# Patient Record
Sex: Male | Born: 1935 | Race: White | Hispanic: No | Marital: Married | Smoking: Never smoker
Health system: Southern US, Community
[De-identification: ages and names within clinical notes are randomized; demographics above are authoritative.]

## PROBLEM LIST (undated history)

## (undated) DIAGNOSIS — I10 Essential (primary) hypertension: Secondary | ICD-10-CM

---

## 2020-12-02 ENCOUNTER — Emergency Department (HOSPITAL_COMMUNITY): Payer: Medicare HMO

## 2020-12-02 ENCOUNTER — Inpatient Hospital Stay (HOSPITAL_COMMUNITY)
Admission: EM | Admit: 2020-12-02 | Discharge: 2021-01-12 | DRG: 480 | Disposition: E | Payer: Medicare HMO | Attending: Internal Medicine | Admitting: Internal Medicine

## 2020-12-02 ENCOUNTER — Encounter (HOSPITAL_COMMUNITY): Payer: Self-pay | Admitting: Internal Medicine

## 2020-12-02 DIAGNOSIS — Z66 Do not resuscitate: Secondary | ICD-10-CM | POA: Diagnosis present

## 2020-12-02 DIAGNOSIS — I444 Left anterior fascicular block: Secondary | ICD-10-CM | POA: Diagnosis present

## 2020-12-02 DIAGNOSIS — N179 Acute kidney failure, unspecified: Secondary | ICD-10-CM | POA: Diagnosis present

## 2020-12-02 DIAGNOSIS — R410 Disorientation, unspecified: Secondary | ICD-10-CM | POA: Diagnosis not present

## 2020-12-02 DIAGNOSIS — Z7982 Long term (current) use of aspirin: Secondary | ICD-10-CM

## 2020-12-02 DIAGNOSIS — Y92009 Unspecified place in unspecified non-institutional (private) residence as the place of occurrence of the external cause: Secondary | ICD-10-CM

## 2020-12-02 DIAGNOSIS — Z20822 Contact with and (suspected) exposure to covid-19: Secondary | ICD-10-CM | POA: Diagnosis present

## 2020-12-02 DIAGNOSIS — G9341 Metabolic encephalopathy: Secondary | ICD-10-CM | POA: Diagnosis not present

## 2020-12-02 DIAGNOSIS — M80052A Age-related osteoporosis with current pathological fracture, left femur, initial encounter for fracture: Principal | ICD-10-CM | POA: Diagnosis present

## 2020-12-02 DIAGNOSIS — Z419 Encounter for procedure for purposes other than remedying health state, unspecified: Secondary | ICD-10-CM

## 2020-12-02 DIAGNOSIS — W1830XA Fall on same level, unspecified, initial encounter: Secondary | ICD-10-CM | POA: Diagnosis present

## 2020-12-02 DIAGNOSIS — E870 Hyperosmolality and hypernatremia: Secondary | ICD-10-CM | POA: Diagnosis not present

## 2020-12-02 DIAGNOSIS — Z7189 Other specified counseling: Secondary | ICD-10-CM | POA: Diagnosis not present

## 2020-12-02 DIAGNOSIS — R339 Retention of urine, unspecified: Secondary | ICD-10-CM | POA: Diagnosis not present

## 2020-12-02 DIAGNOSIS — D539 Nutritional anemia, unspecified: Secondary | ICD-10-CM | POA: Diagnosis not present

## 2020-12-02 DIAGNOSIS — R112 Nausea with vomiting, unspecified: Secondary | ICD-10-CM

## 2020-12-02 DIAGNOSIS — E785 Hyperlipidemia, unspecified: Secondary | ICD-10-CM | POA: Diagnosis present

## 2020-12-02 DIAGNOSIS — Y92019 Unspecified place in single-family (private) house as the place of occurrence of the external cause: Secondary | ICD-10-CM

## 2020-12-02 DIAGNOSIS — N1831 Chronic kidney disease, stage 3a: Secondary | ICD-10-CM | POA: Diagnosis present

## 2020-12-02 DIAGNOSIS — Z79899 Other long term (current) drug therapy: Secondary | ICD-10-CM | POA: Diagnosis not present

## 2020-12-02 DIAGNOSIS — M810 Age-related osteoporosis without current pathological fracture: Secondary | ICD-10-CM | POA: Diagnosis not present

## 2020-12-02 DIAGNOSIS — I10 Essential (primary) hypertension: Secondary | ICD-10-CM | POA: Diagnosis not present

## 2020-12-02 DIAGNOSIS — S72142A Displaced intertrochanteric fracture of left femur, initial encounter for closed fracture: Secondary | ICD-10-CM | POA: Diagnosis present

## 2020-12-02 DIAGNOSIS — I129 Hypertensive chronic kidney disease with stage 1 through stage 4 chronic kidney disease, or unspecified chronic kidney disease: Secondary | ICD-10-CM | POA: Diagnosis present

## 2020-12-02 DIAGNOSIS — D696 Thrombocytopenia, unspecified: Secondary | ICD-10-CM | POA: Diagnosis present

## 2020-12-02 DIAGNOSIS — T148XXA Other injury of unspecified body region, initial encounter: Secondary | ICD-10-CM

## 2020-12-02 DIAGNOSIS — R739 Hyperglycemia, unspecified: Secondary | ICD-10-CM | POA: Diagnosis present

## 2020-12-02 DIAGNOSIS — W19XXXA Unspecified fall, initial encounter: Secondary | ICD-10-CM | POA: Diagnosis not present

## 2020-12-02 DIAGNOSIS — Z515 Encounter for palliative care: Secondary | ICD-10-CM | POA: Diagnosis not present

## 2020-12-02 DIAGNOSIS — H9190 Unspecified hearing loss, unspecified ear: Secondary | ICD-10-CM | POA: Diagnosis present

## 2020-12-02 DIAGNOSIS — F05 Delirium due to known physiological condition: Secondary | ICD-10-CM | POA: Diagnosis not present

## 2020-12-02 HISTORY — DX: Essential (primary) hypertension: I10

## 2020-12-02 LAB — COMPREHENSIVE METABOLIC PANEL
ALT: 18 U/L (ref 0–44)
AST: 26 U/L (ref 15–41)
Albumin: 3.1 g/dL — ABNORMAL LOW (ref 3.5–5.0)
Alkaline Phosphatase: 110 U/L (ref 38–126)
Anion gap: 12 (ref 5–15)
BUN: 18 mg/dL (ref 8–23)
CO2: 26 mmol/L (ref 22–32)
Calcium: 8.5 mg/dL — ABNORMAL LOW (ref 8.9–10.3)
Chloride: 103 mmol/L (ref 98–111)
Creatinine, Ser: 1.17 mg/dL (ref 0.61–1.24)
GFR, Estimated: 58 mL/min — ABNORMAL LOW (ref >60)
Glucose, Bld: 101 mg/dL — ABNORMAL HIGH (ref 70–99)
Potassium: 3.9 mmol/L (ref 3.5–5.1)
Sodium: 141 mmol/L (ref 135–145)
Total Bilirubin: 0.9 mg/dL (ref 0.3–1.2)
Total Protein: 5.4 g/dL — ABNORMAL LOW (ref 6.5–8.1)

## 2020-12-02 LAB — CBC WITH DIFFERENTIAL/PLATELET
Abs Immature Granulocytes: 0.03 10*3/uL (ref 0.00–0.07)
Basophils Absolute: 0 10*3/uL (ref 0.0–0.1)
Basophils Relative: 0 %
Eosinophils Absolute: 0.1 10*3/uL (ref 0.0–0.5)
Eosinophils Relative: 2 %
HCT: 43.1 % (ref 39.0–52.0)
Hemoglobin: 14.2 g/dL (ref 13.0–17.0)
Immature Granulocytes: 1 %
Lymphocytes Relative: 11 %
Lymphs Abs: 0.6 10*3/uL — ABNORMAL LOW (ref 0.7–4.0)
MCH: 32.4 pg (ref 26.0–34.0)
MCHC: 32.9 g/dL (ref 30.0–36.0)
MCV: 98.4 fL (ref 80.0–100.0)
Monocytes Absolute: 0.4 10*3/uL (ref 0.1–1.0)
Monocytes Relative: 8 %
Neutro Abs: 4.1 10*3/uL (ref 1.7–7.7)
Neutrophils Relative %: 78 %
Platelets: 154 10*3/uL (ref 150–400)
RBC: 4.38 MIL/uL (ref 4.22–5.81)
RDW: 13 % (ref 11.5–15.5)
WBC: 5.2 10*3/uL (ref 4.0–10.5)
nRBC: 0 % (ref 0.0–0.2)

## 2020-12-02 LAB — RESP PANEL BY RT-PCR (FLU A&B, COVID) ARPGX2
Influenza A by PCR: NEGATIVE
Influenza B by PCR: NEGATIVE
SARS Coronavirus 2 by RT PCR: NEGATIVE

## 2020-12-02 MED ORDER — IOHEXOL 300 MG/ML  SOLN
100.0000 mL | Freq: Once | INTRAMUSCULAR | Status: AC | PRN
  Administered 2020-12-02: 100 mL via INTRAVENOUS

## 2020-12-02 MED ORDER — HYDROMORPHONE HCL 1 MG/ML IJ SOLN: 0.5000 mg | INTRAMUSCULAR | Status: DC | PRN

## 2020-12-02 MED ORDER — HYDROMORPHONE HCL 1 MG/ML IJ SOLN
0.5000 mg | INTRAMUSCULAR | Status: DC | PRN
  Administered 2020-12-03 – 2020-12-11 (×7): 0.5 mg via INTRAVENOUS
  Filled 2020-12-02 (×4): qty 0.5
  Filled 2020-12-02: qty 1
  Filled 2020-12-02 (×3): qty 0.5

## 2020-12-02 MED ORDER — HYDROCODONE-ACETAMINOPHEN 5-325 MG PO TABS
1.0000 | ORAL_TABLET | Freq: Four times a day (QID) | ORAL | Status: DC | PRN
  Administered 2020-12-05 – 2020-12-11 (×7): 1 via ORAL
  Filled 2020-12-02 (×9): qty 1

## 2020-12-02 MED ORDER — POLYETHYLENE GLYCOL 3350 17 G PO PACK: 17.0000 g | PACK | Freq: Every day | ORAL | Status: DC | PRN

## 2020-12-02 MED ORDER — SODIUM CHLORIDE 0.9 % IV SOLN: INTRAVENOUS | Status: DC

## 2020-12-02 MED ORDER — SODIUM CHLORIDE 0.9 % IV BOLUS
500.0000 mL | Freq: Once | INTRAVENOUS | Status: AC
  Administered 2020-12-02: 17:00:00 500 mL via INTRAVENOUS

## 2020-12-02 MED ORDER — MORPHINE SULFATE (PF) 2 MG/ML IV SOLN
2.0000 mg | Freq: Once | INTRAVENOUS | Status: AC
  Administered 2020-12-02: 17:00:00 2 mg via INTRAVENOUS
  Filled 2020-12-02: qty 1

## 2020-12-02 NOTE — ED Triage Notes (Signed)
Pt arrived from home via [facility] after falling from standing while attempting to retrieve item from ground. Pt usually uses walker to assist with ambulation. Denies striking head. Endorses pain to left upper leg and hip. Shortening and flattening of left leg noted at time of triage. Pain controlled without meds PTA. Pt endorses sever pain with palpation or movement of leg. VSS.

## 2020-12-02 NOTE — ED Provider Notes (Signed)
[name] EMERGENCY DEPARTMENT Provider Note   CSN: [id number] Arrival date & time: 11/15/2020  1513     History Chief Complaint  Patient presents with  . Fall    From standing. Loss of balance while bending over. Usually uses walker to assist with ambulation.    Tom Robertson is a 86 y.o. male.  HPI   Patient is a 86 year old male who presents the emergency department due to a fall.  Patient states that he was reaching for a newspaper on the ground and then fell over to the ground.  Reports exquisite left hip pain.  Patient's left leg is shortened and externally rotated.  Exquisite pain with any movement of the left leg.  Patient unable to ambulate.  Patient ambulates with a walker at baseline.  No other regions of pain or other complaints at this time.  Denies head trauma to me.  Denies any anticoagulation.  Ambulates with a cane at baseline.  Will sometimes use a walker.  I spoke to patient's 2 sons.  They state he is on very few medications.  He takes a hypertension medication as well as aspirin and a multivitamin.  No anticoagulation, per both of patient's sons.  Last oral intake was about 6 hours ago.     No past medical history on file.  There are no problems to display for this patient.   No family history on file.     Home Medications Prior to Admission medications   Not on File    Allergies    Patient has no known allergies.  Review of Systems   Review of Systems  All other systems reviewed and are negative. Ten systems reviewed and are negative for acute change, except as noted in the HPI.    Physical Exam Updated Vital Signs BP (!) 183/104 (BP Location: Right Arm)   Pulse 62   Temp 97.7 F (36.5 C) (Oral)   Resp 15   SpO2 97%   Physical Exam Vitals and nursing note reviewed.  Constitutional:      General: He is not in acute distress.    Appearance: Normal appearance. He is not ill-appearing, toxic-appearing or diaphoretic.   HENT:     Head: Normocephalic and atraumatic.     Right Ear: External ear normal.     Left Ear: External ear normal.     Nose: Nose normal.     Mouth/Throat:     Mouth: Mucous membranes are moist.     Pharynx: Oropharynx is clear. No oropharyngeal exudate or posterior oropharyngeal erythema.  Eyes:     Extraocular Movements: Extraocular movements intact.  Cardiovascular:     Rate and Rhythm: Normal rate and regular rhythm.     Pulses: Normal pulses.     Heart sounds: Normal heart sounds. No murmur heard. No friction rub. No gallop.   Pulmonary:     Effort: Pulmonary effort is normal. No respiratory distress.     Breath sounds: Normal breath sounds. No stridor. No wheezing, rhonchi or rales.  Abdominal:     General: Abdomen is flat.     Palpations: Abdomen is soft.     Tenderness: There is abdominal tenderness.     Comments: Abdomen is soft.  Voluntary guarding.  Tenderness noted along the left central abdomen.    Musculoskeletal:        General: Tenderness, deformity and signs of injury present. Normal range of motion.     Cervical back: Normal range of motion  and neck supple. No tenderness.     Comments: Exquisite tenderness with manipulation of the left hip.  No right hip tenderness.  Left leg is shortened and externally rotated.  Normal-appearing right lower extremity.  Patient able to move right lower extremity.  Distal sensation intact in the lower extremities.  Skin:    General: Skin is warm and dry.  Neurological:     General: No focal deficit present.     Mental Status: He is alert and oriented to person, place, and time.  Psychiatric:        Mood and Affect: Mood normal.        Behavior: Behavior normal.    ED Results / Procedures / Treatments   Labs (all labs ordered are listed, but only abnormal results are displayed) Labs Reviewed  COMPREHENSIVE METABOLIC PANEL - Abnormal; Notable for the following components:      Result Value   Glucose, Bld 101 (*)     Calcium 8.5 (*)    Total Protein 5.4 (*)    Albumin 3.1 (*)    GFR, Estimated 58 (*)    All other components within normal limits  CBC WITH DIFFERENTIAL/PLATELET - Abnormal; Notable for the following components:   Lymphs Abs 0.6 (*)    All other components within normal limits  RESP PANEL BY RT-PCR (FLU A&B, COVID) [id number]   EKG EKG Interpretation  Date/Time:  Wednesday December 02 2020 17:15:23 EST Ventricular Rate:  52 PR Interval:    QRS Duration: 113 QT Interval:  467 QTC Calculation: 435 R Axis:   -77 Text Interpretation: Sinus rhythm Consider left atrial enlargement Left anterior fascicular block Abnormal R-wave progression, late transition Probable left ventricular hypertrophy Confirmed by Kristine Royal [phone]) on 11/14/2020 5:28:19 PM  Radiology DG Pelvis Portable  Result Date: 12/04/2020 CLINICAL DATA:  Larey Seat, left leg deformity EXAM: PORTABLE PELVIS 1-2 VIEWS COMPARISON:  None. FINDINGS: Single frontal view of the pelvis demonstrates an intertrochanteric left hip fracture with mild varus angulation and significant distraction of the fracture fragments. No dislocation. No additional bony abnormalities. Mild symmetrical hip osteoarthritis. Diffuse lower lumbar spondylosis. Extensive vascular calcifications. IMPRESSION: 1. Comminuted intertrochanteric left hip fracture with varus angulation. Electronically Signed   By: Sharlet Salina M.D.   On: 11/25/2020 17:19   DG Chest Portable 1 View  Result Date: 11/11/2020 CLINICAL DATA:  Larey Seat, left hip fracture EXAM: PORTABLE CHEST 1 VIEW COMPARISON:  None. FINDINGS: Single frontal view of the chest demonstrates an unremarkable cardiac silhouette. No airspace disease, effusion, or pneumothorax. No acute bony abnormalities. IMPRESSION: 1. No acute intrathoracic process. Electronically Signed   By: Sharlet Salina M.D.   On: 12/10/2020 17:19   DG Femur Portable 1 View Left  Result Date: 11/23/2020 CLINICAL DATA:  Larey Seat, externally rotated  left hip with obvious deformity EXAM: LEFT FEMUR PORTABLE 1 VIEW COMPARISON:  None. FINDINGS: 2 frontal views. There is a comminuted of the left femur are obtained intertrochanteric left hip fracture with varus angulation. The distal aspect of the femur is externally rotated relative to the fracture site. There is no dislocation. Extensive vascular calcifications are seen. IMPRESSION: 1. Comminuted intertrochanteric left hip fracture. Electronically Signed   By: Sharlet Salina M.D.   On: 11/13/2020 17:18   Procedures Procedures   Medications Ordered in ED Medications  morphine 2 MG/ML injection 2 mg (2 mg Intravenous Given 11/15/2020 1633)  sodium chloride 0.9 % bolus 500 mL (500 mLs Intravenous New Bag/Given 11/25/2020 1635)  iohexol (OMNIPAQUE) 300  MG/ML solution 100 mL (100 mLs Intravenous Contrast Given 12/01/2020 1830)   ED Course  I have reviewed the triage vital signs and the nursing notes.  Pertinent labs & imaging results that were available during my care of the patient were reviewed by me and considered in my medical decision making (see chart for details).  Clinical Course as of 11/16/2020 1837  Wed Dec 02, 2020  2409 Patient discussed with Dr. Jena Gauss with orthopedic surgery.  He recommended admission to medicine.  N.p.o.  Will likely perform surgery on patient tomorrow. [LJ]    Clinical Course User Index [LJ] Jannet Mantis   MDM Rules/Calculators/A&P                         Patient is a 86 year old male who presents to the emergency department with a comminuted intertrochanteric left hip fracture.  This occurred after a fall earlier today.  Patient discussed with his son at bedside.  Not on anticoagulation.  Patient denies any head trauma but CT was obtained of the head, cervical spine, as well as the abdomen and pelvis.  CT images are pending.  Chest x-ray is reassuring.  Patient given IV morphine as well as 500 cc of IV fluids.  Pain under control this time.  Patient was  discussed with Dr. Jena Gauss with orthopedic surgery.  Recommended keeping patient n.p.o. and admission to medicine. Will likely perform surgery on patient tomorrow.  This was discussed with the patient as well as his son and they are amenable with this plan.  Will discuss with the medicine team for admission.  Final Clinical Impression(s) / ED Diagnoses Final diagnoses:  Fall, initial encounter  Closed displaced intertrochanteric fracture of left femur, initial encounter [facility])    Rx / DC Orders ED Discharge Orders    None       Placido Sou, PA-C 12/01/2020 Madilyn Hook, MD 11/29/2020 2342

## 2020-12-02 NOTE — H&P (Signed)
History and Physical   WHITTEN ANDREONI ENI:[id number] DOB: February 27, 1926 DOA: 11/15/2020  PCP: Joycelyn Man, NP   Patient coming from: [facility]  Chief Complaint: Marletta Lor  HPI: DOLORES MCGOVERN is a 86 y.o. male with medical history significant of hypertension who presents with hip pain following a fall from standing height.  Patient was reaching to pick up a newspaper from the ground and fell on his left side.  He reports severe left hip pain which is worse with any movement.  Pain is 10 out of 10 at its worst but is improved to moderate pain now.  He is been unable to walk since the fall.  He does walk with a cane at baseline.  He denies any head trauma or taking any anticoagulation.  He has 2 sons who the EDP spoke with who confirmed that he is on minimal medications only antihypertensive, aspirin and vitamins.  He denies fever, cough, chest pain, shortness of breath, abdominal pain, constipation, diarrhea, nausea.  ED Course: Vitals in ED significant for intermittent hypertension in the 140s to 190s systolic.  Some readings of low heart rate in the upper 50s.  Lab work-up showed BMP that was normal, LFTs with calcium of 8.6 which corrects considering albumin of 3.1, protein of 5.4.  CBC within normal notes.  Respiratory panel for flu and Covid pending.  Patient was given pain medication and and orthopedics was consulted for his fracture.  Chest x-ray was without acute disease.  DG pelvis and DG left femur showed left intratrochanteric hip fracture.  CT head was without acute process, but incidental hyperdense region noticed which may need follow-up MRI.  CT C-spine showed multilevel spondylosis but no acute fracture.  CT abdomen redemonstrated left femoral fracture otherwise showed no acute abdominal or pelvic process diverticulosis without diverticulitis noted  Review of Systems: As per HPI otherwise all other systems reviewed and are negative.  Past Medical History:  Diagnosis Date   . HTN (hypertension)    History reviewed. No pertinent surgical history.  Social History  reports that he has never smoked. He has never used smokeless tobacco. He reports current alcohol use. He reports that he does not use drugs.  No Known Allergies  History reviewed. No pertinent family history. Reviewed on admission  Prior to Admission medications   Not on File  - Antihypertensive, family is not sure what he is taking and neither is he - Aspirin - Multivitamin  Physical Exam: Vitals:   12/05/2020 1525 12/06/2020 1530 11/14/2020 1645 11/13/2020 1715  BP: (!) 183/104 101/69  (!) 197/108  Pulse: 62  (!) 59 (!) 56  Resp: 15  12 12   Temp: 97.7 F (36.5 C)     TempSrc: Oral     SpO2: 97%  98% 100%   Physical Exam Constitutional:      General: He is not in acute distress.    Appearance: Normal appearance.     Comments: Thin elderly male  HENT:     Head: Normocephalic and atraumatic.     Mouth/Throat:     Mouth: Mucous membranes are moist.     Pharynx: Oropharynx is clear.  Eyes:     Extraocular Movements: Extraocular movements intact.     Pupils: Pupils are equal, round, and reactive to light.  Cardiovascular:     Rate and Rhythm: Normal rate and regular rhythm.     Pulses: Normal pulses.     Heart sounds: Murmur heard.    Pulmonary:  Effort: Pulmonary effort is normal. No respiratory distress.     Breath sounds: Normal breath sounds.  Abdominal:     General: Bowel sounds are normal. There is no distension.     Palpations: Abdomen is soft.     Tenderness: There is no abdominal tenderness.  Musculoskeletal:        General: Deformity present. No swelling.     Comments: Left lower extremity is shortened and externally rotated.  He is neurovascularly intact in the left lower extremity.  Skin:    General: Skin is warm and dry.  Neurological:     General: No focal deficit present.     Mental Status: Mental status is at baseline.    Labs on Admission: I have  personally reviewed following labs and imaging studies  CBC: Recent Labs  Lab 12/23/20 1700  WBC 5.2  NEUTROABS 4.1  HGB 14.2  HCT 43.1  MCV 98.4  PLT 154    Basic Metabolic Panel: Recent Labs  Lab December 23, 2020 1700  NA 141  K 3.9  CL 103  CO2 26  GLUCOSE 101*  BUN 18  CREATININE 1.17  CALCIUM 8.5*    GFR: CrCl cannot be calculated (Unknown ideal weight.).  Liver Function Tests: Recent Labs  Lab December 23, 2020 1700  AST 26  ALT 18  ALKPHOS 110  BILITOT 0.9  PROT 5.4*  ALBUMIN 3.1*    Urine analysis: No results found for: COLORURINE, APPEARANCEUR, LABSPEC, PHURINE, GLUCOSEU, HGBUR, BILIRUBINUR, KETONESUR, PROTEINUR, UROBILINOGEN, NITRITE, LEUKOCYTESUR  Radiological Exams on Admission: CT Head Wo Contrast  Result Date: Dec 23, 2020 CLINICAL DATA:  Larey Seat from standing EXAM: CT HEAD WITHOUT CONTRAST TECHNIQUE: Contiguous axial images were obtained from the base of the skull through the vertex without intravenous contrast. COMPARISON:  None. FINDINGS: Brain: Confluent hypodensities throughout the periventricular and subcortical white matter are compatible with chronic small vessel ischemic changes. Hyperdense area in the left frontal periventricular white matter measuring approximately 1.7 cm is indeterminate, but could reflect an area of cortical dysplasia or vascular malformation. Follow-up nonemergent MRI may be useful to better characterize and exclude underlying mass. No evidence of acute infarct or hemorrhage. Lateral ventricles and remaining midline structures are unremarkable. No acute extra-axial fluid collections. No mass effect. Diffuse age-appropriate cerebral atrophy. Vascular: No evidence of hyperdense vessel. Mild atherosclerosis. 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 Skull: Normal. Negative for fracture or focal lesion. Sinuses/Orbits: No acute finding. Other: None. IMPRESSION: 1. No acute intracranial process. 2. Extensive chronic  small-vessel ischemic changes throughout the white matter. 3. Likely incidental 1.7 cm hyperdense region in the left frontal white matter, which could reflect an area of dysplasia or vascular malformation. Follow-up nonemergent MRI with and without contrast may be useful to better characterize and exclude underlying mass lesion. Electronically Signed   By: Sharlet Salina M.D.   On: December 23, 2020 18:53   CT Cervical Spine Wo Contrast  Result Date: Dec 23, 2020 CLINICAL DATA:  Larey Seat from standing, left hip fracture EXAM: CT CERVICAL SPINE WITHOUT CONTRAST TECHNIQUE: Multidetector CT imaging of the cervical spine was performed without intravenous contrast. Multiplanar CT image reconstructions were also generated. COMPARISON:  None. FINDINGS: Alignment: Alignment is anatomic. Skull base and vertebrae: No acute fracture. No primary bone lesion or focal pathologic process. Soft tissues and spinal canal: No prevertebral fluid or swelling. No visible canal hematoma. Disc levels: There is diffuse cervical spondylosis. Mild neural foraminal narrowing is seen at C3-4 and C4-5. No evidence of central canal stenosis. Upper chest: Airway  is patent.  Lung apices are clear. Other: Reconstructed images demonstrate no additional findings. IMPRESSION: 1. Extensive multilevel cervical spondylosis. No acute displaced fracture. Electronically Signed   By: Sharlet Salina M.D.   On: 11/11/2020 18:44   CT ABDOMEN PELVIS W CONTRAST  Result Date: 11/30/2020 CLINICAL DATA:  Left lower quadrant abdominal pain, recent fall, left hip fracture EXAM: CT ABDOMEN AND PELVIS WITH CONTRAST TECHNIQUE: Multidetector CT imaging of the abdomen and pelvis was performed using the standard protocol following bolus administration of intravenous contrast. CONTRAST:  OMNIPAQUE IOHEXOL 300 MG/ML  SOLN COMPARISON:  None. FINDINGS: Lower chest: No acute pleural or parenchymal lung disease. Dense calcification of the mitral annulus. Hepatobiliary: Small  calcified gallstones without cholecystitis. No evidence of liver injury. Pancreas: Unremarkable. No pancreatic ductal dilatation or surrounding inflammatory changes. Spleen: Multiple small hypodensities in the spleen likely reflects cysts or hemangiomas. No evidence of splenic injury. Adrenals/Urinary Tract: There is bilateral renal cortical atrophy. No evidence of renal injury. Bilateral renal cortical cysts are noted. No nephrolithiasis or obstructive uropathy. The bladder and adrenals are unremarkable. Stomach/Bowel: No bowel obstruction or ileus. There is diverticulosis of the distal colon with no evidence of acute diverticulitis. There is a short segment of wall thickening within the mid sigmoid colon reference image 52, without surrounding inflammatory change. This may be due to under distension or scarring from previous bouts of diverticulitis. If patient has bowel symptoms,, follow-up nonemergent colonoscopy or barium enema could be considered. Vascular/Lymphatic: Aortic atherosclerosis. No enlarged abdominal or pelvic lymph nodes. Reproductive: Prostate is likely surgically absent. Other: No free fluid or free intraperitoneal gas. No abdominal wall hernia. Musculoskeletal: The comminuted intertrochanteric fracture seen on previous imaging is again identified, with impaction and mild varus angulation. No dislocation. No other acute displaced fractures. Reconstructed images demonstrate no additional findings. IMPRESSION: 1. Comminuted impacted left femoral neck fracture. 2. Otherwise no acute intra-abdominal or intrapelvic trauma. 3. Diverticulosis of the colon without evidence of acute diverticulitis. Segmental wall thickening of the sigmoid colon may be due to scarring or under distension. 4. Cholelithiasis without cholecystitis. 5.  Aortic Atherosclerosis (ICD10-I70.0). Electronically Signed   By: Sharlet Salina M.D.   On: 12/06/2020 19:03   DG Pelvis Portable  Result Date: 12/10/2020 CLINICAL DATA:   Larey Seat, left leg deformity EXAM: PORTABLE PELVIS 1-2 VIEWS COMPARISON:  None. FINDINGS: Single frontal view of the pelvis demonstrates an intertrochanteric left hip fracture with mild varus angulation and significant distraction of the fracture fragments. No dislocation. No additional bony abnormalities. Mild symmetrical hip osteoarthritis. Diffuse lower lumbar spondylosis. Extensive vascular calcifications. IMPRESSION: 1. Comminuted intertrochanteric left hip fracture with varus angulation. Electronically Signed   By: Sharlet Salina M.D.   On: 12/07/2020 17:19   DG Chest Portable 1 View  Result Date: 12/08/2020 CLINICAL DATA:  Larey Seat, left hip fracture EXAM: PORTABLE CHEST 1 VIEW COMPARISON:  None. FINDINGS: Single frontal view of the chest demonstrates an unremarkable cardiac silhouette. No airspace disease, effusion, or pneumothorax. No acute bony abnormalities. IMPRESSION: 1. No acute intrathoracic process. Electronically Signed   By: Sharlet Salina M.D.   On: 11/20/2020 17:19   DG Femur Portable 1 View Left  Result Date: 11/27/2020 CLINICAL DATA:  Larey Seat, externally rotated left hip with obvious deformity EXAM: LEFT FEMUR PORTABLE 1 VIEW COMPARISON:  None. FINDINGS: 2 frontal views. There is a comminuted of the left femur are obtained intertrochanteric left hip fracture with varus angulation. The distal aspect of the femur is externally rotated relative to the  fracture site. There is no dislocation. Extensive vascular calcifications are seen. IMPRESSION: 1. Comminuted intertrochanteric left hip fracture. Electronically Signed   By: Sharlet SalinaMichael  Brown M.D.   On: 11/18/2020 17:18    EKG: Independently reviewed.  Sinus rhythm, 52 bpm.  Some baseline artifact.  Assessment/Plan Principal Problem:   Closed comminuted intertrochanteric fracture of left femur (HCC) Active Problems:   Fall at home, initial encounter   HTN (hypertension)   Osteoporosis  Fall Osteoporosis Intertrochanteric fracture of the  left femur > Patient had a fall from standing height while reaching to pick a newspaper up off the ground.  No preceding symptoms no loss of consciousness typically walks with a cane. > Did not hit his head, not on anticoagulation > Orthopedics consulted [facility] and will see patient in the morning > With fracture from fall from standing height meets definition for osteoporosis. - Appreciate orthopedics recommendations - Pain control with as needed hydrocodone for moderate pain and as needed Dilaudid for severe pain - Hip fracture precautions - AM labs, check vitamin D level - N.p.o. midnight  Hypertension > Intermittently hypertensive in ED > Is on antihypertensive medication but neither he nor family is sure what - We will hold medication for now and add as needed's if needed  CT findings > CT head was without acute process, but incidental hyperdense region noticed, follow-up nonemergent MRI recommended  DVT prophylaxis: SCDs, surgery in the a.m.  Code Status:   DNR  Family Communication:  Discussed with son Brett CanalesSteve, by phone.  He has another son as well.   Disposition Plan:   [place]  Anticipated DC to:  Skilled nursing  Anticipated DC date:  Pending clinical course  Anticipated DC barriers: None  Consults called:  Orthopedics, Dr. Jena GaussHaddix consulted by EDP  Admission status:  Inpatient, MedSurg  Severity of Illness: The appropriate patient status for this patient is INPATIENT. Inpatient status is judged to be reasonable and necessary in order to provide the required intensity of service to ensure the patient's safety. The patient's presenting symptoms, physical exam findings, and initial radiographic and laboratory data in the context of their chronic comorbidities is felt to place them at high risk for further clinical deterioration. Furthermore, it is not anticipated that the patient will be medically stable for discharge from the hospital within 2 midnights of  admission. The following factors support the patient status of inpatient.   " The patient's presenting symptoms include hip pain, fall. " The worrisome physical exam findings include hip pain. " The initial radiographic and laboratory data are worrisome because of x-ray showing left intratrochanteric hip fracture. " The chronic co-morbidities include hypertension.   * I certify that at the point of admission it is my clinical judgment that the patient will require inpatient hospital care spanning beyond 2 midnights from the point of admission due to high intensity of service, high risk for further deterioration and high frequency of surveillance required.Synetta Fail*   Tavien Chestnut B Karita Dralle MD [facility]  How to contact the [facility] Attending or Consulting provider 7A - 7P or covering provider during after hours 7P -7A, for this patient?   1. Check the care team in [facility] and look for a) attending/consulting [facility] provider listed and b) the [facility] team listed 2. Log into [email or web address] and use [facility]'s universal password to access. If you do not have the password, please contact the hospital operator. 3. Locate the [facility] provider you are looking for under [facility]  and page to a number that you can be directly reached. 4. If you still have difficulty reaching the provider, please page the [facility] (Director on Call) for the Hospitalists listed on amion for assistance.  11/18/2020, 8:03 PM

## 2020-12-02 NOTE — ED Notes (Signed)
Patient denies pain and is resting comfortably.  

## 2020-12-02 NOTE — ED Notes (Signed)
In to check on pt, and pt had taken all leads off, gown off, all blankets off and trying to get out of bed. Pt had pulled out IV from left hand. No bleeding noted. Gauze applied. Pt placed back on monitor and gown placed back on pt. Asked pt to remain in bed and patient laughed. Asked pt if he knew where he was and pt stated "In this bed". Will continue to monitor pt. Bed alarm turned on.

## 2020-12-02 NOTE — Progress Notes (Signed)
Ortho Trauma Note  Received call from PA [facility] regarding this patient.  86 year old male with ground-level fall with a left intertrochanteric femur fracture.  No known history of blood thinners.  We will plan for surgery tomorrow.  Patient is tentatively posted for surgery.  Will recommend n.p.o. after midnight.  Formal orthopedic consult to follow in the morning.  Roby Lofts, MD [facility] [phone] (office) [email or web address]

## 2020-12-03 ENCOUNTER — Inpatient Hospital Stay (HOSPITAL_COMMUNITY): Payer: Medicare HMO | Admitting: Certified Registered"

## 2020-12-03 ENCOUNTER — Inpatient Hospital Stay (HOSPITAL_COMMUNITY): Payer: Medicare HMO

## 2020-12-03 ENCOUNTER — Encounter (HOSPITAL_COMMUNITY): Admission: EM | Disposition: E | Payer: Self-pay | Source: Home / Self Care | Attending: Internal Medicine

## 2020-12-03 ENCOUNTER — Encounter (HOSPITAL_COMMUNITY): Payer: Self-pay | Admitting: Internal Medicine

## 2020-12-03 DIAGNOSIS — I10 Essential (primary) hypertension: Secondary | ICD-10-CM

## 2020-12-03 DIAGNOSIS — W19XXXA Unspecified fall, initial encounter: Secondary | ICD-10-CM | POA: Diagnosis not present

## 2020-12-03 DIAGNOSIS — M810 Age-related osteoporosis without current pathological fracture: Secondary | ICD-10-CM | POA: Diagnosis not present

## 2020-12-03 DIAGNOSIS — Y92009 Unspecified place in unspecified non-institutional (private) residence as the place of occurrence of the external cause: Secondary | ICD-10-CM

## 2020-12-03 DIAGNOSIS — E785 Hyperlipidemia, unspecified: Secondary | ICD-10-CM

## 2020-12-03 DIAGNOSIS — S72142A Displaced intertrochanteric fracture of left femur, initial encounter for closed fracture: Secondary | ICD-10-CM | POA: Diagnosis not present

## 2020-12-03 HISTORY — PX: INTRAMEDULLARY (IM) NAIL INTERTROCHANTERIC: SHX5875

## 2020-12-03 LAB — BASIC METABOLIC PANEL
Anion gap: 13 (ref 5–15)
BUN: 16 mg/dL (ref 8–23)
CO2: 25 mmol/L (ref 22–32)
Calcium: 8.6 mg/dL — ABNORMAL LOW (ref 8.9–10.3)
Chloride: 102 mmol/L (ref 98–111)
Creatinine, Ser: 1.17 mg/dL (ref 0.61–1.24)
GFR, Estimated: 58 mL/min — ABNORMAL LOW (ref >60)
Glucose, Bld: 136 mg/dL — ABNORMAL HIGH (ref 70–99)
Potassium: 4.1 mmol/L (ref 3.5–5.1)
Sodium: 140 mmol/L (ref 135–145)

## 2020-12-03 LAB — PROTIME-INR
INR: 1.1 (ref 0.8–1.2)
Prothrombin Time: 14.1 seconds (ref 11.4–15.2)

## 2020-12-03 LAB — CBC
HCT: 41.5 % (ref 39.0–52.0)
Hemoglobin: 13.4 g/dL (ref 13.0–17.0)
MCH: 31.8 pg (ref 26.0–34.0)
MCHC: 32.3 g/dL (ref 30.0–36.0)
MCV: 98.3 fL (ref 80.0–100.0)
Platelets: 145 10*3/uL — ABNORMAL LOW (ref 150–400)
RBC: 4.22 MIL/uL (ref 4.22–5.81)
RDW: 12.9 % (ref 11.5–15.5)
WBC: 9 10*3/uL (ref 4.0–10.5)
nRBC: 0 % (ref 0.0–0.2)

## 2020-12-03 LAB — GLUCOSE, CAPILLARY: Glucose-Capillary: 136 mg/dL — ABNORMAL HIGH (ref 70–99)

## 2020-12-03 LAB — VITAMIN D 25 HYDROXY (VIT D DEFICIENCY, FRACTURES): Vit D, 25-Hydroxy: 46.05 ng/mL (ref 30–100)

## 2020-12-03 SURGERY — FIXATION, FRACTURE, INTERTROCHANTERIC, WITH INTRAMEDULLARY ROD
Anesthesia: General | Laterality: Left

## 2020-12-03 MED ORDER — ROCURONIUM BROMIDE 10 MG/ML (PF) SYRINGE
PREFILLED_SYRINGE | INTRAVENOUS | Status: AC
  Filled 2020-12-03: qty 10

## 2020-12-03 MED ORDER — CEFAZOLIN SODIUM-DEXTROSE 2-4 GM/100ML-% IV SOLN
INTRAVENOUS | Status: AC
  Filled 2020-12-03: qty 100

## 2020-12-03 MED ORDER — LACTATED RINGERS IV SOLN: INTRAVENOUS | Status: DC | PRN

## 2020-12-03 MED ORDER — LIDOCAINE 2% (20 MG/ML) 5 ML SYRINGE
INTRAMUSCULAR | Status: AC
  Filled 2020-12-03: qty 5

## 2020-12-03 MED ORDER — DEXAMETHASONE SODIUM PHOSPHATE 10 MG/ML IJ SOLN
INTRAMUSCULAR | Status: AC
  Filled 2020-12-03: qty 1

## 2020-12-03 MED ORDER — ENOXAPARIN SODIUM 40 MG/0.4ML ~~LOC~~ SOLN
40.0000 mg | SUBCUTANEOUS | Status: DC
  Administered 2020-12-04: 09:00:00 40 mg via SUBCUTANEOUS
  Filled 2020-12-03: qty 0.4

## 2020-12-03 MED ORDER — ONDANSETRON HCL 4 MG/2ML IJ SOLN
INTRAMUSCULAR | Status: DC | PRN
Start: 2020-12-03 — End: 2020-12-03
  Administered 2020-12-03: 4 mg via INTRAVENOUS

## 2020-12-03 MED ORDER — OXYCODONE HCL 5 MG PO TABS: 5.0000 mg | ORAL_TABLET | Freq: Once | ORAL | Status: DC | PRN

## 2020-12-03 MED ORDER — ROCURONIUM BROMIDE 100 MG/10ML IV SOLN
INTRAVENOUS | Status: DC | PRN
  Administered 2020-12-03: 60 mg via INTRAVENOUS

## 2020-12-03 MED ORDER — FENTANYL CITRATE (PF) 250 MCG/5ML IJ SOLN
INTRAMUSCULAR | Status: AC
  Filled 2020-12-03: qty 5

## 2020-12-03 MED ORDER — FENTANYL CITRATE (PF) 100 MCG/2ML IJ SOLN: 25.0000 ug | INTRAMUSCULAR | Status: DC | PRN

## 2020-12-03 MED ORDER — CEFAZOLIN SODIUM-DEXTROSE 2-3 GM-%(50ML) IV SOLR
INTRAVENOUS | Status: DC | PRN
  Administered 2020-12-03: 2 g via INTRAVENOUS

## 2020-12-03 MED ORDER — ONDANSETRON HCL 4 MG/2ML IJ SOLN: 4.0000 mg | Freq: Once | INTRAMUSCULAR | Status: DC | PRN

## 2020-12-03 MED ORDER — ALBUMIN HUMAN 5 % IV SOLN: INTRAVENOUS | Status: DC | PRN

## 2020-12-03 MED ORDER — FENTANYL CITRATE (PF) 100 MCG/2ML IJ SOLN
INTRAMUSCULAR | Status: DC | PRN
  Administered 2020-12-03: 25 ug via INTRAVENOUS
  Administered 2020-12-03: 50 ug via INTRAVENOUS

## 2020-12-03 MED ORDER — VANCOMYCIN HCL 1000 MG IV SOLR
INTRAVENOUS | Status: DC | PRN
  Administered 2020-12-03: 1000 mg

## 2020-12-03 MED ORDER — DEXAMETHASONE SODIUM PHOSPHATE 10 MG/ML IJ SOLN
INTRAMUSCULAR | Status: DC | PRN
  Administered 2020-12-03: 4 mg via INTRAVENOUS

## 2020-12-03 MED ORDER — OXYCODONE HCL 5 MG/5ML PO SOLN: 5.0000 mg | Freq: Once | ORAL | Status: DC | PRN

## 2020-12-03 MED ORDER — ONDANSETRON HCL 4 MG/2ML IJ SOLN
INTRAMUSCULAR | Status: AC
  Filled 2020-12-03: qty 2

## 2020-12-03 MED ORDER — LIDOCAINE 2% (20 MG/ML) 5 ML SYRINGE
INTRAMUSCULAR | Status: DC | PRN
  Administered 2020-12-03: 60 mg via INTRAVENOUS

## 2020-12-03 MED ORDER — VANCOMYCIN HCL 1000 MG IV SOLR
INTRAVENOUS | Status: AC
  Filled 2020-12-03: qty 1000

## 2020-12-03 MED ORDER — SUGAMMADEX SODIUM 200 MG/2ML IV SOLN
INTRAVENOUS | Status: DC | PRN
  Administered 2020-12-03: 125 mg via INTRAVENOUS

## 2020-12-03 MED ORDER — EPHEDRINE SULFATE-NACL 50-0.9 MG/10ML-% IV SOSY
PREFILLED_SYRINGE | INTRAVENOUS | Status: DC | PRN
  Administered 2020-12-03 (×4): 5 mg via INTRAVENOUS

## 2020-12-03 MED ORDER — CEFAZOLIN SODIUM-DEXTROSE 2-4 GM/100ML-% IV SOLN
2.0000 g | Freq: Three times a day (TID) | INTRAVENOUS | Status: AC
  Administered 2020-12-03 – 2020-12-04 (×3): 2 g via INTRAVENOUS
  Filled 2020-12-03 (×3): qty 100

## 2020-12-03 MED ORDER — LIDOCAINE 2% (20 MG/ML) 5 ML SYRINGE
INTRAMUSCULAR | Status: AC
  Filled 2020-12-03: qty 15

## 2020-12-03 MED ORDER — AMISULPRIDE (ANTIEMETIC) 5 MG/2ML IV SOLN: 10.0000 mg | Freq: Once | INTRAVENOUS | Status: DC | PRN

## 2020-12-03 MED ORDER — PROPOFOL 10 MG/ML IV BOLUS
INTRAVENOUS | Status: DC | PRN
Start: 2020-12-03 — End: 2020-12-03
  Administered 2020-12-03: 60 mg via INTRAVENOUS

## 2020-12-03 MED ORDER — PROPOFOL 10 MG/ML IV BOLUS
INTRAVENOUS | Status: AC
  Filled 2020-12-03: qty 20

## 2020-12-03 MED ORDER — 0.9 % SODIUM CHLORIDE (POUR BTL) OPTIME
TOPICAL | Status: DC | PRN
  Administered 2020-12-03: 15:00:00 1000 mL

## 2020-12-03 SURGICAL SUPPLY — 48 items
ADH SKN CLS APL DERMABOND .7 (GAUZE/BANDAGES/DRESSINGS) ×1
APL PRP STRL LF DISP 70% ISPRP (MISCELLANEOUS) ×1
BIT DRILL INTERTAN LAG SCREW (BIT) ×1 IMPLANT
BIT DRILL LONG 4.0 (BIT) IMPLANT
BRUSH SCRUB EZ PLAIN DRY (MISCELLANEOUS) ×4 IMPLANT
CHLORAPREP W/TINT 26 (MISCELLANEOUS) ×2 IMPLANT
COVER PERINEAL POST (MISCELLANEOUS) ×2 IMPLANT
COVER SURGICAL LIGHT HANDLE (MISCELLANEOUS) ×2 IMPLANT
DERMABOND ADVANCED (GAUZE/BANDAGES/DRESSINGS) ×1
DERMABOND ADVANCED .7 DNX12 (GAUZE/BANDAGES/DRESSINGS) ×1 IMPLANT
DRAPE C-ARM 35X43 STRL (DRAPES) ×2 IMPLANT
DRAPE IMP U-DRAPE 54X76 (DRAPES) ×4 IMPLANT
DRAPE INCISE IOBAN 66X45 STRL (DRAPES) ×2 IMPLANT
DRAPE STERI IOBAN 125X83 (DRAPES) ×2 IMPLANT
DRAPE SURG 17X23 STRL (DRAPES) ×4 IMPLANT
DRAPE U-SHAPE 47X51 STRL (DRAPES) ×2 IMPLANT
DRILL BIT LONG 4.0 (BIT) ×2
DRSG MEPILEX BORDER 4X4 (GAUZE/BANDAGES/DRESSINGS) ×2 IMPLANT
DRSG MEPILEX BORDER 4X8 (GAUZE/BANDAGES/DRESSINGS) ×2 IMPLANT
ELECT REM PT RETURN 9FT ADLT (ELECTROSURGICAL) ×2
ELECTRODE REM PT RTRN 9FT ADLT (ELECTROSURGICAL) ×1 IMPLANT
GLOVE BIO SURGEON STRL SZ 6.5 (GLOVE) ×6 IMPLANT
GLOVE BIO SURGEON STRL SZ7.5 (GLOVE) ×8 IMPLANT
GLOVE BIOGEL PI IND STRL 6.5 (GLOVE) ×1 IMPLANT
GLOVE BIOGEL PI IND STRL 7.5 (GLOVE) ×1 IMPLANT
GLOVE BIOGEL PI INDICATOR 6.5 (GLOVE) ×1
GLOVE BIOGEL PI INDICATOR 7.5 (GLOVE) ×1
GOWN STRL REUS W/ TWL LRG LVL3 (GOWN DISPOSABLE) ×1 IMPLANT
GOWN STRL REUS W/TWL LRG LVL3 (GOWN DISPOSABLE) ×2
GUIDE PIN 3.2X343 (PIN) ×2
GUIDE PIN 3.2X343MM (PIN) ×4
KIT BASIN OR (CUSTOM PROCEDURE TRAY) ×2 IMPLANT
KIT TURNOVER KIT B (KITS) ×2 IMPLANT
MANIFOLD NEPTUNE II (INSTRUMENTS) ×2 IMPLANT
NAIL INTERTAN 10X18 130D 10S (Nail) ×1 IMPLANT
NS IRRIG 1000ML POUR BTL (IV SOLUTION) ×2 IMPLANT
PACK GENERAL/GYN (CUSTOM PROCEDURE TRAY) ×2 IMPLANT
PAD ARMBOARD 7.5X6 YLW CONV (MISCELLANEOUS) ×4 IMPLANT
PIN GUIDE 3.2X343MM (PIN) IMPLANT
SCREW LAG COMPR KIT 105/100 (Screw) ×1 IMPLANT
SCREW TRIGEN LOW PROF 5.0X37.5 (Screw) ×1 IMPLANT
SUT MNCRL AB 3-0 PS2 18 (SUTURE) ×2 IMPLANT
SUT VIC AB 0 CT1 27 (SUTURE) ×2
SUT VIC AB 0 CT1 27XBRD ANBCTR (SUTURE) IMPLANT
SUT VIC AB 2-0 CT1 27 (SUTURE) ×4
SUT VIC AB 2-0 CT1 TAPERPNT 27 (SUTURE) ×2 IMPLANT
TOWEL GREEN STERILE (TOWEL DISPOSABLE) ×4 IMPLANT
WATER STERILE IRR 1000ML POUR (IV SOLUTION) ×2 IMPLANT

## 2020-12-03 NOTE — Op Note (Signed)
Orthopaedic Surgery Operative Note (CSN: [id number] ) Date of Surgery: 12/21/2020  Admit Date: 11/19/2020   Diagnoses: Pre-Op Diagnoses: Left intertrochanteric femur fracture   Post-Op Diagnosis: Same  Procedures: CPT 27245-Cephalomedullary nailing of left intertrochanteric femur fracture   Surgeons : Primary: Roby Lofts, MD  Assistant: Ulyses Southward, PA-C  Location: OR 3   Anesthesia:General  Antibiotics: Ancef 2g preop with 1 gm vancomycin powder placed topically   Tourniquet time: None  Estimated Blood Loss:100 mL  Complications:None   Specimens:None   Implants: Implant Name Type Inv. Item Serial No. Manufacturer Lot No. LRB No. Used Action  NAIL INTERTAN 10X18 130D 10S - [id number] Nail NAIL INTERTAN 10X18 130D 10S  [employer] [id number] Left 1 Implanted  SCREW LAG DUAL 105/100 - [id number] Screw SCREW LAG DUAL 105/100  [employer] [id number] Left 1 Implanted  SCREW TRIGEN LOW PROF 5.0X37.5 - [id number] Screw SCREW TRIGEN LOW PROF 5.0X37.5  [employer]  Left 1 Implanted     Indications for Surgery: 86 year old male who sustained a ground-level fall and has a left intertrochanteric femur fracture.  I recommended undergoing cephalomedullary nailing.  Risks and benefits were discussed with the patient's son.  Risks include but not limited to bleeding, infection, malunion, nonunion, hardware failure, hardware irritation, nerve and blood vessel injury, DVT, even the possibility anesthetic complications.  The patient's family agreed to proceed with surgery and consent was obtained.  Operative Findings: Cephalomedullary nailing of left intertrochanteric femur fracture using [employer] InterTAN 10 x 180 mm with a 105/100 mm lag screw/compression screw combo  Procedure: The patient was identified in the preoperative holding area. Consent was confirmed with the patient and their family and all questions were answered. The  operative extremity was marked after confirmation with the patient. he was then brought back to the operating room by our anesthesia colleagues.  He was placed under general anesthetic and carefully transferred over to a Hana table.  His feet were positioned appropriately in the boots.  All bony prominences were well-padded.  Fluoroscopic imaging was obtained.  Traction was applied to the lower extremity to obtain adequate reduction.  Left lower extremity was then prepped and draped in usual sterile fashion.  A timeout was performed to verify the patient, the procedure, and the extremity.  The incision was made proximal to the greater trochanter.  I split the gluteal fascia in line with my incision and I directed a threaded guidewire at the tip of the greater trochanter.  I then used an entry reamer to enter the medullary canal.  I then passed the 180 x 10 mm InterTAN nail down the center canal.  I used the targeting arm to make a lateral incision along the lateral femur.  I directed a threaded guidewire up into the head neck segment.  I confirmed that the tip apex distance was adequate.  I then drilled the compression screw path.  I placed an antirotation bar in place.  I then placed the lag screw and then compressed approximately 5 mm.  I then statically locked the the nail.  I then used the targeting arm to place a distal interlocking screw.  Final fluoroscopic imaging was obtained.  The incisions were copiously irrigated.  A gram of vancomycin powder was placed into the incisions.  A layer closure of 0 Vicryl, 2-0 Vicryl and 3-0 Monocryl.  Dermabond was used to seal the incision.  Sterile dressings were placed.  The patient was then awoken from  anesthesia and taken to the PACU in stable condition.  Post Op Plan/Instructions: Patient will be weightbearing as tolerated to left lower extremity.  He will receive Lovenox for DVT prophylaxis.  He will receive Ancef for surgical prophylaxis.  We will mobilize  him with physical and Occupational Therapy.  I was present and performed the entire surgery.  Ulyses Southward, PA-C did assist me throughout the case. An assistant was necessary given the difficulty in approach, maintenance of reduction and ability to instrument the fracture.   Truitt Merle, MD [facility]

## 2020-12-03 NOTE — Transfer of Care (Signed)
Immediate Anesthesia Transfer of Care Note  Patient: Tom Robertson  Procedure(s) Performed: INTRAMEDULLARY (IM) NAIL INTERTROCHANTRIC (Left )  Patient Location: PACU  Anesthesia Type:General  Level of Consciousness: drowsy  Airway & Oxygen Therapy: Patient Spontanous Breathing and Patient connected to face mask oxygen  Post-op Assessment: Report given to RN and Post -op Vital signs reviewed and stable  Post vital signs: Reviewed and stable  Last Vitals:  Vitals Value Taken Time  BP 152/72 12/02/2020 1539  Temp    Pulse 65 11/25/2020 1541  Resp 12 11/13/2020 1541  SpO2 95 % 12/09/2020 1541  Vitals shown include unvalidated device data.  Last Pain:  Vitals:   11/23/2020 1228  TempSrc:   PainSc: 3          Complications: No complications documented.

## 2020-12-03 NOTE — ED Notes (Signed)
Sitter to bedside

## 2020-12-03 NOTE — Anesthesia Procedure Notes (Signed)
Procedure Name: Intubation Date/Time: 11/15/2020 2:23 PM Performed by: Marny Lowenstein, CRNA Pre-anesthesia Checklist: Patient identified, Emergency Drugs available, Suction available and Patient being monitored Patient Re-evaluated:Patient Re-evaluated prior to induction Oxygen Delivery Method: Circle system utilized Preoxygenation: Pre-oxygenation with 100% oxygen Induction Type: IV induction Ventilation: Mask ventilation without difficulty Laryngoscope Size: Agyeman and 2 Grade View: Grade I Tube type: Oral Tube size: 7.0 mm Number of attempts: 1 Airway Equipment and Method: Patient positioned with wedge pillow and Stylet Placement Confirmation: ETT inserted through vocal cords under direct vision,  positive ETCO2 and breath sounds checked- equal and bilateral Secured at: 24 cm Tube secured with: Tape Dental Injury: Teeth and Oropharynx as per pre-operative assessment

## 2020-12-03 NOTE — Consult Note (Signed)
Orthopaedic Trauma Service (OTS) Consult   Patient ID: Tom Robertson MRN: [id number] DOB/AGE: 07/09/1926 86 y.o.  Reason for Consult:Left intertrochanteric femur fracture Referring Physician: Dr. Kristine Royal, MD [facility]  HPI: Tom Robertson is an 86 y.o. male who is being seen in consultation at request of Dr. Rodena Medin for evaluation of left hip fracture.  Patient has a history of hypertension.  According to reports she was reaching down to pick up a newspaper when he fell on the ground.  He had inability bear weight and deformity to his left lower extremity.  He presents emergency room where x-rays showed a left intertrochanteric femur fracture.  I was consulted for evaluation.  Patient was seen and evaluated this morning.  Currently comfortable but is agitated.  A sitter is at bedside.  Patient is not oriented to place.  Seems confused as to whether his hip is broken but is in significant amount of pain.  I did discuss things over with his son.  He does ambulate without an assist device at baseline.  He lives at [facility] independent living facility.  Denies any numbness or tingling.  Denies any injuries anywhere else however his history is unreliable.  Past Medical History:  Diagnosis Date  . HTN (hypertension)     History reviewed. No pertinent surgical history.  History reviewed. No pertinent family history.  Social History:  reports that he has never smoked. He has never used smokeless tobacco. He reports current alcohol use. He reports that he does not use drugs.  Allergies: No Known Allergies  Medications:  No current facility-administered medications on file prior to encounter.   Current Outpatient Medications on File Prior to Encounter  Medication Sig Dispense Refill  . aspirin EC 81 MG tablet Take 81 mg by mouth daily. Swallow whole.    Marland Kitchen atorvastatin (LIPITOR) 20 MG tablet Take 20 mg by mouth daily.    Marland Kitchen lisinopril (ZESTRIL) 20 MG tablet Take 20 mg by mouth  daily.    . Multiple Vitamin (MULTIVITAMIN) tablet Take 1 tablet by mouth daily.      ROS: Unable to perform secondary to patient's confusion  Exam: Blood pressure (!) 153/93, pulse 64, temperature 97.7 F (36.5 C), temperature source Oral, resp. rate 17, SpO2 95 %. General: No acute distress Orientation: Patient is awake and alert but is not oriented to place or time Mood and Affect: Cooperative and pleasant Gait: Unable to assess due to his fracture Coordination and balance: Within normal limits  Left lower extremity: No skin lesions.  No bruising or ecchymosis.  Obvious deformity to the lower extremity with shortening and external rotation.  Unable to tolerate any range of motion.  No deformity about the knee and no swelling.  Ankle motion is intact.  Endorses sensation to the dorsum and plantar aspect of his foot.  He has a warm well-perfused foot.  Right lower extremity: Skin without lesions. No tenderness to palpation. Full painless ROM, full strength in each muscle groups without evidence of instability.   Medical Decision Making: Data: Imaging: X-rays were reviewed which shows a comminuted left intertrochanteric femur fracture with significant varus displacement.  Labs:  Results for orders placed or performed during the hospital encounter of 11/21/2020 (from the past 24 hour(s))  Resp Panel by RT-PCR (Flu A&B, Covid) Nasopharyngeal Swab     Status: None   Collection Time: 11/18/2020  3:52 PM   Specimen: Nasopharyngeal Swab; Nasopharyngeal(NP) swabs in vial transport medium  Result Value Ref  Range   SARS Coronavirus 2 by RT PCR NEGATIVE NEGATIVE   Influenza A by PCR NEGATIVE NEGATIVE   Influenza B by PCR NEGATIVE NEGATIVE  Comprehensive metabolic panel     Status: Abnormal   Collection Time: Dec 25, 2020  5:00 PM  Result Value Ref Range   Sodium 141 135 - 145 mmol/L   Potassium 3.9 3.5 - 5.1 mmol/L   Chloride 103 98 - 111 mmol/L   CO2 26 22 - 32 mmol/L   Glucose, Bld 101 (H) 70  - 99 mg/dL   BUN 18 8 - 23 mg/dL   Creatinine, Ser 2.72 0.61 - 1.24 mg/dL   Calcium 8.5 (L) 8.9 - 10.3 mg/dL   Total Protein 5.4 (L) 6.5 - 8.1 g/dL   Albumin 3.1 (L) 3.5 - 5.0 g/dL   AST 26 15 - 41 U/L   ALT 18 0 - 44 U/L   Alkaline Phosphatase 110 38 - 126 U/L   Total Bilirubin 0.9 0.3 - 1.2 mg/dL   GFR, Estimated 58 (L) >60 mL/min   Anion gap 12 5 - 15  CBC with Differential     Status: Abnormal   Collection Time: 12/25/20  5:00 PM  Result Value Ref Range   WBC 5.2 4.0 - 10.5 K/uL   RBC 4.38 4.22 - 5.81 MIL/uL   Hemoglobin 14.2 13.0 - 17.0 g/dL   HCT 53.6 64.4 - 03.4 %   MCV 98.4 80.0 - 100.0 fL   MCH 32.4 26.0 - 34.0 pg   MCHC 32.9 30.0 - 36.0 g/dL   RDW 74.2 59.5 - 63.8 %   Platelets 154 150 - 400 K/uL   nRBC 0.0 0.0 - 0.2 %   Neutrophils Relative % 78 %   Neutro Abs 4.1 1.7 - 7.7 K/uL   Lymphocytes Relative 11 %   Lymphs Abs 0.6 (L) 0.7 - 4.0 K/uL   Monocytes Relative 8 %   Monocytes Absolute 0.4 0.1 - 1.0 K/uL   Eosinophils Relative 2 %   Eosinophils Absolute 0.1 0.0 - 0.5 K/uL   Basophils Relative 0 %   Basophils Absolute 0.0 0.0 - 0.1 K/uL   Immature Granulocytes 1 %   Abs Immature Granulocytes 0.03 0.00 - 0.07 K/uL  CBC     Status: Abnormal   Collection Time: 12/10/2020  5:30 AM  Result Value Ref Range   WBC 9.0 4.0 - 10.5 K/uL   RBC 4.22 4.22 - 5.81 MIL/uL   Hemoglobin 13.4 13.0 - 17.0 g/dL   HCT 75.6 43.3 - 29.5 %   MCV 98.3 80.0 - 100.0 fL   MCH 31.8 26.0 - 34.0 pg   MCHC 32.3 30.0 - 36.0 g/dL   RDW 18.8 41.6 - 60.6 %   Platelets 145 (L) 150 - 400 K/uL   nRBC 0.0 0.0 - 0.2 %  Basic metabolic panel     Status: Abnormal   Collection Time: 12/04/2020  5:30 AM  Result Value Ref Range   Sodium 140 135 - 145 mmol/L   Potassium 4.1 3.5 - 5.1 mmol/L   Chloride 102 98 - 111 mmol/L   CO2 25 22 - 32 mmol/L   Glucose, Bld 136 (H) 70 - 99 mg/dL   BUN 16 8 - 23 mg/dL   Creatinine, Ser 3.01 0.61 - 1.24 mg/dL   Calcium 8.6 (L) 8.9 - 10.3 mg/dL   GFR, Estimated 58  (L) >60 mL/min   Anion gap 13 5 - 15  Protime-INR  Status: None   Collection Time: 12/19/20  5:30 AM  Result Value Ref Range   Prothrombin Time 14.1 11.4 - 15.2 seconds   INR 1.1 0.8 - 1.2    Imaging or Labs ordered: None  Medical history and chart was reviewed and case discussed with medical provider.  Assessment/Plan: 86 year old male with a left intertrochanteric femur fracture.  Patient will require cephalomedullary nailing of his left hip fracture.  I discussed this over the phone with his son.  I discussed risks and benefits with the patient's son.  Risks included but not limited to bleeding, infection, malunion, nonunion, hardware failure, hardware irritation, nerve and blood vessel injury, DVT, even the possibility anesthetic complications.  He agrees to proceed with surgery and consent will be obtained.  We will plan for surgery later today.  Roby Lofts, MD [facility] [phone] (office) [email or web address]

## 2020-12-03 NOTE — Anesthesia Preprocedure Evaluation (Signed)
Anesthesia Evaluation  Patient identified by MRN, date of birth, ID band Patient awake    Reviewed: Allergy & Precautions, NPO status , Patient's Chart, lab work & pertinent test results  History of Anesthesia Complications Negative for: history of anesthetic complications  Airway Mallampati: II  TM Distance: >3 FB Neck ROM: Full    Dental  (+) Teeth Intact   Pulmonary neg pulmonary ROS,    Pulmonary exam normal        Cardiovascular hypertension, Pt. on medications Normal cardiovascular exam  HLD   Neuro/Psych negative neurological ROS  negative psych ROS   GI/Hepatic negative GI ROS, Neg liver ROS,   Endo/Other  negative endocrine ROS  Renal/GU negative Renal ROS  negative genitourinary   Musculoskeletal negative musculoskeletal ROS (+)   Abdominal   Peds  Hematology negative hematology ROS (+)   Anesthesia Other Findings  plts 145, INR 1.1  Reproductive/Obstetrics                           Anesthesia Physical Anesthesia Plan  ASA: III  Anesthesia Plan: General   Post-op Pain Management:    Induction: Intravenous  PONV Risk Score and Plan: 2 and Ondansetron, Dexamethasone, Treatment may vary due to age or medical condition and Midazolam  Airway Management Planned: Oral ETT  Additional Equipment: None  Intra-op Plan:   Post-operative Plan: Extubation in OR  Informed Consent: I have reviewed the patients History and Physical, chart, labs and discussed the procedure including the risks, benefits and alternatives for the proposed anesthesia with the patient or authorized representative who has indicated his/her understanding and acceptance.   Patient has DNR.  Discussed DNR with power of attorney and Suspend DNR.   Dental advisory given and Consent reviewed with POA  Plan Discussed with:   Anesthesia Plan Comments:        Anesthesia Quick Evaluation

## 2020-12-03 NOTE — ED Notes (Signed)
IV fluids stopped due to pt pulling at line and will not leave IV alone unless someone at bedside.

## 2020-12-03 NOTE — ED Notes (Signed)
Pt continues to pull everything off. Will not keep monitor leads on, IV cannot stay connected without someone at bedside or pt will pull it out. Pt will not leave pulse ox and blood pressure cuff on. This RN has been into pts room everytime walked by to dress pt and place leads back on pt. Called for a sitter, but none are available.

## 2020-12-03 NOTE — Progress Notes (Signed)
PROGRESS NOTE    Tom Robertson  UXN:[id number] DOB: 1926-02-22 DOA: 12-05-2020 PCP: Joycelyn Man, NP   Brief Narrative:  HPI per Dr. Beola Cord on 2020/12/05 Tom Robertson is a 86 y.o. male with medical history significant of hypertension who presents with hip pain following a fall from standing height.  Patient was reaching to pick up a newspaper from the ground and fell on his left side.  He reports severe left hip pain which is worse with any movement.  Pain is 10 out of 10 at its worst but is improved to moderate pain now.  He is been unable to walk since the fall.  He does walk with a cane at baseline.  He denies any head trauma or taking any anticoagulation.  He has 2 sons who the EDP spoke with who confirmed that he is on minimal medications only antihypertensive, aspirin and vitamins.  He denies fever, cough, chest pain, shortness of breath, abdominal pain, constipation, diarrhea, nausea.  ED Course: Vitals in ED significant for intermittent hypertension in the 140s to 190s systolic.  Some readings of low heart rate in the upper 50s.  Lab work-up showed BMP that was normal, LFTs with calcium of 8.6 which corrects considering albumin of 3.1, protein of 5.4.  CBC within normal notes.  Respiratory panel for flu and Covid pending.  Patient was given pain medication and and orthopedics was consulted for his fracture.  Chest x-ray was without acute disease.  DG pelvis and DG left femur showed left intratrochanteric hip fracture.  CT head was without acute process, but incidental hyperdense region noticed which may need follow-up MRI.  CT C-spine showed multilevel spondylosis but no acute fracture.  CT abdomen redemonstrated left femoral fracture otherwise showed no acute abdominal or pelvic process diverticulosis without diverticulitis noted  **Interim History Patient remains in the ED and appears comfortable.  He was little agitated yesterday and had to have a one-to-one  sitter.  Son was updated and awaiting surgery evaluation.  Current plan is to take the patient for surgical intervention for his intertrochanteric fracture of the left femur the setting of his acute fall.  PT OT will need to evaluate the patient after surgical intervention to determine disposition  Assessment & Plan:   Principal Problem:   Closed comminuted intertrochanteric fracture of left femur (HCC) Active Problems:   Fall at home, initial encounter   HTN (hypertension)   Osteoporosis  Acute Fall Osteoporosis Intertrochanteric fracture of the left femur in the setting of Fall -Patient had a fall from standing height while reaching to pick a newspaper up off the ground.  No preceding symptoms no loss of consciousness typically walks with a cane. -Did not hit his head, not on anticoagulation -Orthopedics consulted by EDP and will see patient in the morning; they are planning for Surgery this AM and have tentatively posted the patient for surgery -With fracture from fall from standing height meets definition for osteoporosis. -Appreciate orthopedics recommendations -Pain control initiated and c/w Hydrocodone-Acetaminophen 1 tab po q6hprn Moderate Pain, and Hydromorphone 0.5 mg IV q4hprn Severe Pain; Given IV Morphine 2 mg in the ED -C/w Miralax 17 grams po Dailyprn -Hip fracture precautions -Takes Aspirin 81 mg po Daily at Home. Will hold for surgery  -C/w Supportive care and getting IVF with NS at 75 mL/hr; given a 500 mL bolus in the ED -AM labs repeated, check vitamin D level -N.p.o. midnight and remains NPO for anticipated surgery this AM  -Further  Care per Orthopedic Surgery  Hypertension -Intermittently hypertensive in ED; Last blood Pressure was 147/78 but had been elevated to 197/108 and likely related to pain -Patient takes Lisinopril 20 mg po Daily at home; Currently hold due to NPO status  -We will hold medication for now and add as needed Hydralazine if warranted    Hyperglycemia -Patient's Blood Sugar has been elevated on Daily BMP/CMP; on Admission was 101 and repeat this AM was 136 -Check HbA1c in the AM -Continue to Monitor Blood Sugars carefully and if Necessary will place on Sensitive Novolog SSI AC  Thrombocytopenia -Mild as Platelet Count went from 154 -> 145 -Continue to Monitor for S/Sx of Bleeding; Currently no overt bleeding noted -Repeat CBC in the AM   HLD -Takes Atorvastatin 20 mg po Daily; Currently hold while npo and resume in the AM  CT findings -CT head was without acute process, but incidental 1.7 cm hyperdense region noticed in the Left Frontal White matter that could reflect an area of dysplasia or vascular malformation -Follow-up nonemergent MRI with and without contrast recommended to better characterize and exclude underlying mass lesion  -Can be done after Hip procedure or in the outpatient setting   GOC: DNR, poA  DVT prophylaxis: SCDs for Surgery; Defer timing of pharmacological prophylaxis to Orthopedic Surgery Code Status: DO NOT RESUSCITATE Family Communication: Spoke to son [name] the phone  Disposition Plan: Pending further evaluation by PT/OT, Surgery, and Clearance by Orthopedic Surgery  Status is: Inpatient  Remains inpatient appropriate because:Unsafe d/c plan, IV treatments appropriate due to intensity of illness or inability to take PO and Inpatient level of care appropriate due to severity of illness  Dispo: The patient is from: Home              Anticipated d/c is to: SNF              Anticipated d/c date is: 2 days              Patient currently is not medically stable to d/c.  Consultants:   Orthopedic Surgery   Procedures: Plan for surgery   Antimicrobials:  Anti-infectives (From admission, onward)   None        Subjective: Seen and examined at bedside and he is slightly hard of hearing but resting comfortably and denies any pain.  I asked if the patient had seen the surgeon yet and  he states "hell no."  He denies any nausea or vomiting.  Feels okay.  No chest pain or shortness breath.  No other concerns or complaints at this time.  Objective: Vitals:   11/21/2020 0500 12/05/2020 0530 12/08/2020 0600 12/10/2020 0630  BP: 135/84 139/78 (!) 151/87 (!) 147/78  Pulse: 78 74 75 75  Resp: Temp:      TempSrc:      SpO2: 97% 96% 95% 97%    Intake/Output Summary (Last 24 hours) at 11/24/2020 0826 Last data filed at 12/07/2020 0530 Gross per 24 hour  Intake 1100 ml  Output --  Net 1100 ml   There were no vitals filed for this visit.  Examination: Physical Exam:  Constitutional: WN/WD elderly Caucasian male currently in no acute distress appears calm but does appear little uncomfortable Eyes:  Lids and conjunctivae normal, sclerae anicteric  ENMT: External Ears, Nose appear normal.  He has a hard of hearing Neck: Appears normal, supple, no cervical masses, normal ROM, no appreciable thyromegaly; no JVD Respiratory: Diminished to auscultation bilaterally,  no wheezing, rales, rhonchi or crackles. Normal respiratory effort and patient is not tachypenic. No accessory muscle use.  Unlabored breathing Cardiovascular: RRR, no murmurs / rubs / gallops. S1 and S2 auscultated.  Abdomen: Soft, non-tender, non-distended. Bowel sounds positive.  GU: Deferred. Musculoskeletal: No clubbing / cyanosis of digits/nails.  Left hip is externally rotated and slightly shorter.  Skin: No rashes, ulcers but he has some lower skin lesions noted on his legs. No induration; Warm and dry.  Neurologic: CN 2-12 grossly intact with no focal deficits. Romberg sign and cerebellar reflexes not assessed.  Psychiatric: Mildly impaired judgment and insight.  Patient is awake and alert. Normal mood and appropriate affect.   Data Reviewed: I have personally reviewed following labs and imaging studies  CBC: Recent Labs  Lab 12-28-2020 1700 12/11/2020 0530  WBC 5.2 9.0  NEUTROABS 4.1  --   HGB 14.2  13.4  HCT 43.1 41.5  MCV 98.4 98.3  PLT 154 145*   Basic Metabolic Panel: Recent Labs  Lab 12-28-2020 1700 12/06/2020 0530  NA 141 140  K 3.9 4.1  CL 103 102  CO2 26 25  GLUCOSE 101* 136*  BUN 18 16  CREATININE 1.17 1.17  CALCIUM 8.5* 8.6*   GFR: CrCl cannot be calculated (Unknown ideal weight.). Liver Function Tests: Recent Labs  Lab 12-28-2020 1700  AST 26  ALT 18  ALKPHOS 110  BILITOT 0.9  PROT 5.4*  ALBUMIN 3.1*   No results for input(s): LIPASE, AMYLASE in the last 168 hours. No results for input(s): AMMONIA in the last 168 hours. Coagulation Profile: Recent Labs  Lab 11/17/2020 0530  INR 1.1   Cardiac Enzymes: No results for input(s): CKTOTAL, CKMB, CKMBINDEX, TROPONINI in the last 168 hours. BNP (last 3 results) No results for input(s): PROBNP in the last 8760 hours. HbA1C: No results for input(s): HGBA1C in the last 72 hours. CBG: No results for input(s): GLUCAP in the last 168 hours. Lipid Profile: No results for input(s): CHOL, HDL, LDLCALC, TRIG, CHOLHDL, LDLDIRECT in the last 72 hours. Thyroid Function Tests: No results for input(s): TSH, T4TOTAL, FREET4, T3FREE, THYROIDAB in the last 72 hours. Anemia Panel: No results for input(s): VITAMINB12, FOLATE, FERRITIN, TIBC, IRON, RETICCTPCT in the last 72 hours. Sepsis Labs: No results for input(s): PROCALCITON, LATICACIDVEN in the last 168 hours.  Recent Results (from the past 240 hour(s))  Resp Panel by RT-PCR (Flu A&B, Covid) Nasopharyngeal Swab     Status: None   Collection Time: 2020-12-28  3:52 PM   Specimen: Nasopharyngeal Swab; Nasopharyngeal(NP) swabs in vial transport medium  Result Value Ref Range Status   SARS Coronavirus 2 by RT PCR NEGATIVE NEGATIVE Final    Comment: (NOTE) SARS-CoV-2 target nucleic acids are NOT DETECTED.  The SARS-CoV-2 RNA is generally detectable in upper respiratory specimens during the acute phase of infection. The lowest concentration of SARS-CoV-2 viral copies this  assay can detect is 138 copies/mL. A negative result does not preclude SARS-Cov-2 infection and should not be used as the sole basis for treatment or other patient management decisions. A negative result may occur with  improper specimen collection/handling, submission of specimen other than nasopharyngeal swab, presence of viral mutation(s) within the areas targeted by this assay, and inadequate number of viral copies(<138 copies/mL). A negative result must be combined with clinical observations, patient history, and epidemiological information. The expected result is Negative.  Fact Sheet for Patients:  BloggerCourse.com  Fact Sheet for Healthcare Providers:  SeriousBroker.it  This test is  no t yet approved or cleared by the [place] and  has been authorized for detection and/or diagnosis of SARS-CoV-2 by FDA under an Emergency Use Authorization (EUA). This EUA will remain  in effect (meaning this test can be used) for the duration of the COVID-19 declaration under Section 564(b)(1) of the Act, 21 U.S.C.section 360bbb-3(b)(1), unless the authorization is terminated  or revoked sooner.       Influenza A by PCR NEGATIVE NEGATIVE Final   Influenza B by PCR NEGATIVE NEGATIVE Final    Comment: (NOTE) The Xpert Xpress SARS-CoV-2/FLU/RSV plus assay is intended as an aid in the diagnosis of influenza from Nasopharyngeal swab specimens and should not be used as a sole basis for treatment. Nasal washings and aspirates are unacceptable for Xpert Xpress SARS-CoV-2/FLU/RSV testing.  Fact Sheet for Patients: BloggerCourse.com  Fact Sheet for Healthcare Providers: SeriousBroker.it  This test is not yet approved or cleared by the [place] FDA and has been authorized for detection and/or diagnosis of SARS-CoV-2 by FDA under an Emergency Use Authorization (EUA). This EUA will  remain in effect (meaning this test can be used) for the duration of the COVID-19 declaration under Section 564(b)(1) of the Act, 21 U.S.C. section 360bbb-3(b)(1), unless the authorization is terminated or revoked.  Performed at [facility], [address], [place], [place] [address]      RN Pressure Injury Documentation:     There is no height or weight on file to calculate BMI.  Malnutrition Type:   Malnutrition Characteristics:   Nutrition Interventions:    Radiology Studies: CT Head Wo Contrast  Result Date: 12/05/2020 CLINICAL DATA:  Larey Seat from standing EXAM: CT HEAD WITHOUT CONTRAST TECHNIQUE: Contiguous axial images were obtained from the base of the skull through the vertex without intravenous contrast. COMPARISON:  None. FINDINGS: Brain: Confluent hypodensities throughout the periventricular and subcortical white matter are compatible with chronic small vessel ischemic changes. Hyperdense area in the left frontal periventricular white matter measuring approximately 1.7 cm is indeterminate, but could reflect an area of cortical dysplasia or vascular malformation. Follow-up nonemergent MRI may be useful to better characterize and exclude underlying mass. No evidence of acute infarct or hemorrhage. Lateral ventricles and remaining midline structures are unremarkable. No acute extra-axial fluid collections. No mass effect. Diffuse age-appropriate cerebral atrophy. Vascular: No evidence of hyperdense vessel. Mild atherosclerosis. 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 Skull: Normal. Negative for fracture or focal lesion. Sinuses/Orbits: No acute finding. Other: None. IMPRESSION: 1. No acute intracranial process. 2. Extensive chronic small-vessel ischemic changes throughout the white matter. 3. Likely incidental 1.7 cm hyperdense region in the left frontal white matter, which could reflect an area of dysplasia or vascular malformation. Follow-up  nonemergent MRI with and without contrast may be useful to better characterize and exclude underlying mass lesion. Electronically Signed   By: Sharlet Salina M.D.   On: 11/28/2020 18:53   CT Cervical Spine Wo Contrast  Result Date: 11/19/2020 CLINICAL DATA:  Larey Seat from standing, left hip fracture EXAM: CT CERVICAL SPINE WITHOUT CONTRAST TECHNIQUE: Multidetector CT imaging of the cervical spine was performed without intravenous contrast. Multiplanar CT image reconstructions were also generated. COMPARISON:  None. FINDINGS: Alignment: Alignment is anatomic. Skull base and vertebrae: No acute fracture. No primary bone lesion or focal pathologic process. Soft tissues and spinal canal: No prevertebral fluid or swelling. No visible canal hematoma. Disc levels: There is diffuse cervical spondylosis. Mild neural foraminal narrowing is seen at C3-4 and  C4-5. No evidence of central canal stenosis. Upper chest: Airway is patent.  Lung apices are clear. Other: Reconstructed images demonstrate no additional findings. IMPRESSION: 1. Extensive multilevel cervical spondylosis. No acute displaced fracture. Electronically Signed   By: Sharlet SalinaMichael  Brown M.D.   On: 11/15/2020 18:44   CT ABDOMEN PELVIS W CONTRAST  Result Date: 12/05/2020 CLINICAL DATA:  Left lower quadrant abdominal pain, recent fall, left hip fracture EXAM: CT ABDOMEN AND PELVIS WITH CONTRAST TECHNIQUE: Multidetector CT imaging of the abdomen and pelvis was performed using the standard protocol following bolus administration of intravenous contrast. CONTRAST:  100mL OMNIPAQUE IOHEXOL 300 MG/ML  SOLN COMPARISON:  None. FINDINGS: Lower chest: No acute pleural or parenchymal lung disease. Dense calcification of the mitral annulus. Hepatobiliary: Small calcified gallstones without cholecystitis. No evidence of liver injury. Pancreas: Unremarkable. No pancreatic ductal dilatation or surrounding inflammatory changes. Spleen: Multiple small hypodensities in the spleen  likely reflects cysts or hemangiomas. No evidence of splenic injury. Adrenals/Urinary Tract: There is bilateral renal cortical atrophy. No evidence of renal injury. Bilateral renal cortical cysts are noted. No nephrolithiasis or obstructive uropathy. The bladder and adrenals are unremarkable. Stomach/Bowel: No bowel obstruction or ileus. There is diverticulosis of the distal colon with no evidence of acute diverticulitis. There is a short segment of wall thickening within the mid sigmoid colon reference image 52, without surrounding inflammatory change. This may be due to under distension or scarring from previous bouts of diverticulitis. If patient has bowel symptoms,, follow-up nonemergent colonoscopy or barium enema could be considered. Vascular/Lymphatic: Aortic atherosclerosis. No enlarged abdominal or pelvic lymph nodes. Reproductive: Prostate is likely surgically absent. Other: No free fluid or free intraperitoneal gas. No abdominal wall hernia. Musculoskeletal: The comminuted intertrochanteric fracture seen on previous imaging is again identified, with impaction and mild varus angulation. No dislocation. No other acute displaced fractures. Reconstructed images demonstrate no additional findings. IMPRESSION: 1. Comminuted impacted left femoral neck fracture. 2. Otherwise no acute intra-abdominal or intrapelvic trauma. 3. Diverticulosis of the colon without evidence of acute diverticulitis. Segmental wall thickening of the sigmoid colon may be due to scarring or under distension. 4. Cholelithiasis without cholecystitis. 5.  Aortic Atherosclerosis (ICD10-I70.0). Electronically Signed   By: Sharlet SalinaMichael  Brown M.D.   On: 12/06/2020 19:03   DG Pelvis Portable  Result Date: 12/01/2020 CLINICAL DATA:  Larey SeatFell, left leg deformity EXAM: PORTABLE PELVIS 1-2 VIEWS COMPARISON:  None. FINDINGS: Single frontal view of the pelvis demonstrates an intertrochanteric left hip fracture with mild varus angulation and significant  distraction of the fracture fragments. No dislocation. No additional bony abnormalities. Mild symmetrical hip osteoarthritis. Diffuse lower lumbar spondylosis. Extensive vascular calcifications. IMPRESSION: 1. Comminuted intertrochanteric left hip fracture with varus angulation. Electronically Signed   By: Sharlet SalinaMichael  Brown M.D.   On: 11/24/2020 17:19   DG Chest Portable 1 View  Result Date: 12/04/2020 CLINICAL DATA:  Larey SeatFell, left hip fracture EXAM: PORTABLE CHEST 1 VIEW COMPARISON:  None. FINDINGS: Single frontal view of the chest demonstrates an unremarkable cardiac silhouette. No airspace disease, effusion, or pneumothorax. No acute bony abnormalities. IMPRESSION: 1. No acute intrathoracic process. Electronically Signed   By: Sharlet SalinaMichael  Brown M.D.   On: 12/07/2020 17:19   DG Femur Portable 1 View Left  Result Date: 12/08/2020 CLINICAL DATA:  Larey SeatFell, externally rotated left hip with obvious deformity EXAM: LEFT FEMUR PORTABLE 1 VIEW COMPARISON:  None. FINDINGS: 2 frontal views. There is a comminuted of the left femur are obtained intertrochanteric left hip fracture with varus angulation. The distal  aspect of the femur is externally rotated relative to the fracture site. There is no dislocation. Extensive vascular calcifications are seen. IMPRESSION: 1. Comminuted intertrochanteric left hip fracture. Electronically Signed   By:Sharlet Salinarown M.D.   On: 12/06/2020 17:18   Scheduled Meds: Continuous Infusions: . sodium chloride Stopped (12-05-20 0530)    LOS: 1 day   Merlene Laughter, DO [facility] PAGER is on AMION  If 7PM-7AM, please contact night-coverage [email or web address]

## 2020-12-03 NOTE — Anesthesia Postprocedure Evaluation (Signed)
Anesthesia Post Note  Patient: Tom Robertson  Procedure(s) Performed: INTRAMEDULLARY (IM) NAIL INTERTROCHANTRIC (Left )     Patient location during evaluation: PACU Anesthesia Type: General Level of consciousness: awake and alert Pain management: pain level controlled Vital Signs Assessment: post-procedure vital signs reviewed and stable Respiratory status: spontaneous breathing, nonlabored ventilation and respiratory function stable Cardiovascular status: blood pressure returned to baseline and stable Postop Assessment: no apparent nausea or vomiting Anesthetic complications: no   No complications documented.  Last Vitals:  Vitals:   12/02/2020 1649 11/12/2020 1812  BP: 136/75 128/77  Pulse: 78 83  Resp: 16 16  Temp: (!) 36.4 C 36.5 C  SpO2: 93% 92%    Last Pain:  Vitals:   11/19/2020 1812  TempSrc: Oral  PainSc:                  Lucretia Kern

## 2020-12-04 DIAGNOSIS — S72142A Displaced intertrochanteric fracture of left femur, initial encounter for closed fracture: Secondary | ICD-10-CM | POA: Diagnosis not present

## 2020-12-04 LAB — COMPREHENSIVE METABOLIC PANEL
ALT: 21 U/L (ref 0–44)
AST: 52 U/L — ABNORMAL HIGH (ref 15–41)
Albumin: 2.9 g/dL — ABNORMAL LOW (ref 3.5–5.0)
Alkaline Phosphatase: 72 U/L (ref 38–126)
Anion gap: 16 — ABNORMAL HIGH (ref 5–15)
BUN: 25 mg/dL — ABNORMAL HIGH (ref 8–23)
CO2: 22 mmol/L (ref 22–32)
Calcium: 8.3 mg/dL — ABNORMAL LOW (ref 8.9–10.3)
Chloride: 103 mmol/L (ref 98–111)
Creatinine, Ser: 1.71 mg/dL — ABNORMAL HIGH (ref 0.61–1.24)
GFR, Estimated: 37 mL/min — ABNORMAL LOW (ref >60)
Glucose, Bld: 141 mg/dL — ABNORMAL HIGH (ref 70–99)
Potassium: 4 mmol/L (ref 3.5–5.1)
Sodium: 141 mmol/L (ref 135–145)
Total Bilirubin: 0.9 mg/dL (ref 0.3–1.2)
Total Protein: 5.1 g/dL — ABNORMAL LOW (ref 6.5–8.1)

## 2020-12-04 LAB — CBC WITH DIFFERENTIAL/PLATELET
Abs Immature Granulocytes: 0.07 10*3/uL (ref 0.00–0.07)
Basophils Absolute: 0 10*3/uL (ref 0.0–0.1)
Basophils Relative: 0 %
Eosinophils Absolute: 0 10*3/uL (ref 0.0–0.5)
Eosinophils Relative: 0 %
HCT: 36.1 % — ABNORMAL LOW (ref 39.0–52.0)
Hemoglobin: 11.4 g/dL — ABNORMAL LOW (ref 13.0–17.0)
Immature Granulocytes: 1 %
Lymphocytes Relative: 3 %
Lymphs Abs: 0.5 10*3/uL — ABNORMAL LOW (ref 0.7–4.0)
MCH: 31.8 pg (ref 26.0–34.0)
MCHC: 31.6 g/dL (ref 30.0–36.0)
MCV: 100.6 fL — ABNORMAL HIGH (ref 80.0–100.0)
Monocytes Absolute: 1.1 10*3/uL — ABNORMAL HIGH (ref 0.1–1.0)
Monocytes Relative: 7 %
Neutro Abs: 13.8 10*3/uL — ABNORMAL HIGH (ref 1.7–7.7)
Neutrophils Relative %: 89 %
Platelets: 139 10*3/uL — ABNORMAL LOW (ref 150–400)
RBC: 3.59 MIL/uL — ABNORMAL LOW (ref 4.22–5.81)
RDW: 13.2 % (ref 11.5–15.5)
WBC: 15.5 10*3/uL — ABNORMAL HIGH (ref 4.0–10.5)
nRBC: 0 % (ref 0.0–0.2)

## 2020-12-04 LAB — PHOSPHORUS: Phosphorus: 4.5 mg/dL (ref 2.5–4.6)

## 2020-12-04 LAB — MAGNESIUM: Magnesium: 1.6 mg/dL — ABNORMAL LOW (ref 1.7–2.4)

## 2020-12-04 LAB — HEMOGLOBIN A1C
Hgb A1c MFr Bld: 5.7 % — ABNORMAL HIGH (ref 4.8–5.6)
Mean Plasma Glucose: 116.89 mg/dL

## 2020-12-04 MED ORDER — MAGNESIUM SULFATE 4 GM/100ML IV SOLN
4.0000 g | Freq: Once | INTRAVENOUS | Status: AC
  Administered 2020-12-04: 13:00:00 4 g via INTRAVENOUS
  Filled 2020-12-04: qty 100

## 2020-12-04 MED ORDER — ACETAMINOPHEN 325 MG PO TABS
650.0000 mg | ORAL_TABLET | Freq: Four times a day (QID) | ORAL | Status: DC | PRN
  Administered 2020-12-04 – 2020-12-10 (×3): 650 mg via ORAL
  Filled 2020-12-04 (×3): qty 2

## 2020-12-04 MED ORDER — POLYETHYLENE GLYCOL 3350 17 G PO PACK
17.0000 g | PACK | Freq: Every day | ORAL | Status: DC
  Administered 2020-12-04 – 2020-12-11 (×8): 17 g via ORAL
  Filled 2020-12-04 (×7): qty 1

## 2020-12-04 MED ORDER — ENOXAPARIN SODIUM 30 MG/0.3ML ~~LOC~~ SOLN
30.0000 mg | SUBCUTANEOUS | Status: DC
  Administered 2020-12-05 – 2020-12-12 (×7): 30 mg via SUBCUTANEOUS
  Filled 2020-12-04 (×8): qty 0.3

## 2020-12-04 NOTE — Progress Notes (Signed)
° ° °  Subjective: Patient reports pain as mild.  Tolerating diet.  Urinating.  +Flatus.  No CP, SOB.   OOB to chair  Objective:   VITALS:   Vitals:   11/17/2020 2243 12/04/20 0105 12/04/20 0355 12/04/20 0803  BP: 127/73 126/78 135/79 110/67  Pulse: (!) 105 91 96 91  Resp: 16 16 16 14   Temp: 97.8 F (36.6 C) 97.8 F (36.6 C) 97.9 F (36.6 C) (!) 97.5 F (36.4 C)  TempSrc: Axillary Oral Oral Oral  SpO2: 92% 96% 100% 99%   CBC Latest Ref Rng & Units 12/04/2020 12/02/2020 2020-12-27  WBC 4.0 - 10.5 K/uL 15.5(H) 9.0 5.2  Hemoglobin 13.0 - 17.0 g/dL 11.4(L) 13.4 14.2  Hematocrit 39.0 - 52.0 % 36.1(L) 41.5 43.1  Platelets 150 - 400 K/uL 139(L) 145(L) 154   BMP Latest Ref Rng & Units 12/04/2020 12/11/2020 2020-12-27  Glucose 70 - 99 mg/dL 12/04/2020) 098(J) 191(Y)  BUN 8 - 23 mg/dL 782(N) 16 18  Creatinine 0.61 - 1.24 mg/dL 56(O) 1.30(Q 6.57  Sodium 135 - 145 mmol/L 141 140 141  Potassium 3.5 - 5.1 mmol/L 4.0 4.1 3.9  Chloride 98 - 111 mmol/L 103 102 103  CO2 22 - 32 mmol/L 22 25 26   Calcium 8.9 - 10.3 mg/dL 8.3(L) 8.6(L) 8.5(L)   Intake/Output      12/23 0701 12/24 0700 12/24 0701 12/25 0700   P.O. 60    I.V. 1550    IV Piggyback 400    Total Intake 2010    Urine 0    Blood 100    Total Output 100    Net +1910         Urine Occurrence 2 x       Physical Exam: General: NAD.  Resting comfortably in chair Resp: No increased wob Cardio: regular rate and rhythm ABD soft Neurologically intact MSK Neurovascularly intact Sensation intact distally Intact pulses distally Dorsiflexion/Plantar flexion intact Incision: dressing C/D/I Neurovascular intact Compartment soft    Assessment: 1 Day Post-Op  S/P Procedure(s) (LRB): INTRAMEDULLARY (IM) NAIL INTERTROCHANTRIC (Left) by Dr. 1/25 on 12/07/2020  Principal Problem:   Closed comminuted intertrochanteric fracture of left femur (HCC) Active Problems:   Fall at home, initial encounter   HTN (hypertension)    Osteoporosis      Plan: Advance diet Up with therapy D/C IV fluids Discharge to SNF Incentive Spirometry Apply ice   Weight Bearing: Weight Bearing as Tolerated (WBAT) LLE Dressings: Maintain Mepilex.   VTE prophylaxis: Lovenox, SCDs, ambulation Dispo: Skilled Nursing Facility/Rehab  When cleared by PT/OT   Jena Gauss, PA-C 12/04/2020, 2:59 PM

## 2020-12-04 NOTE — Plan of Care (Signed)
  Problem: Safety: Goal: Ability to remain free from injury will improve Outcome: Progressing   Problem: Skin Integrity: Goal: Risk for impaired skin integrity will decrease Outcome: Progressing   

## 2020-12-04 NOTE — Evaluation (Signed)
Physical Therapy Evaluation Patient Details Name: Tom Robertson MRN: [id number] DOB: May 14, 1926 Today's Date: 12/04/2020   History of Present Illness  Tom Robertson is a 86 y.o. male with medical history significant of hypertension who presents with hip pain following a fall from standing height sustaining a L intertrochanteric femur fx. Patient s/p cephalomedullary L femur fx 12/23.  Clinical Impression  PTA, patient lived at [facility]F and ambulated with no AD. Patient presents today with deficits in strength, endurance, balance, and mobility. Patient requires heavy assistance for all mobility. Trialed use of RW, however patient unable to problem solve use with cueing. Performed stand pivot and sit to stand transfer with 2 person under arm assist. Patient will benefit from skilled PT services during acute stay to address listed deficits. Recommend SNF following discharge to maximize functional mobility and return to PLOF.     Follow Up Recommendations SNF;Supervision/Assistance - 24 hour    Equipment Recommendations   (defer topost acute rehab)    Recommendations for Other Services       Precautions / Restrictions Precautions Precautions: Fall Restrictions Weight Bearing Restrictions: Yes LLE Weight Bearing: Weight bearing as tolerated      Mobility  Bed Mobility Overal bed mobility: Needs Assistance Bed Mobility: Supine to Sit     Supine to sit: Max assist;+2 for physical assistance;+2 for safety/equipment     General bed mobility comments: assist for B LE advancement and trunk elevation    Transfers Overall transfer level: Needs assistance Equipment used: None;Rolling Kyera Felan (2 wheeled) Transfers: Sit to/from [employer] Sit to Stand: Max assist;+2 physical assistance;+2 safety/equipment Stand pivot transfers: +2 physical assistance;+2 safety/equipment;Total assist       General transfer comment: initially trialed RW but patient unable to problem  solve use with cueing. Performed sit to stand with 2 person under arm assist and t/f to chair with stand pivot  Ambulation/Gait                Stairs            Wheelchair Mobility    Modified Rankin (Stroke Patients Only)       Balance Overall balance assessment: Needs assistance;History of Falls Sitting-balance support: Single extremity supported;Feet supported Sitting balance-Leahy Scale: Poor Sitting balance - Comments: reliant on single UE support Postural control: Posterior lean Standing balance support: Bilateral upper extremity supported;During functional activity Standing balance-Leahy Scale: Poor Standing balance comment: heavy posterior lean with patient unaware                             Pertinent Vitals/Pain Pain Assessment: Faces Faces Pain Scale: Hurts whole lot Pain Location: L hip Pain Descriptors / Indicators: Grimacing;Guarding Pain Intervention(s): Monitored during session;Limited activity within patient's tolerance    Home Living Family/patient expects to be discharged to:: Other (Comment)                 Additional Comments: ILF    Prior Function Level of Independence: Independent               Hand Dominance        Extremity/Trunk Assessment   Upper Extremity Assessment Upper Extremity Assessment: Defer to OT evaluation    Lower Extremity Assessment Lower Extremity Assessment: Generalized weakness       Communication   Communication: HOH  Cognition Arousal/Alertness: Awake/alert Behavior During Therapy: WFL for tasks assessed/performed Overall Cognitive Status: No family/caregiver present to determine baseline cognitive  functioning                                 General Comments: Oriented to self      General Comments      Exercises     Assessment/Plan    PT Assessment Patient needs continued PT services  PT Problem List Decreased strength;Decreased range of  motion;Decreased activity tolerance;Decreased balance;Decreased mobility;Decreased safety awareness       PT Treatment Interventions DME instruction;Gait training;Functional mobility training;Therapeutic exercise;Therapeutic activities;Balance training;Patient/family education    PT Goals (Current goals can be found in the Care Plan section)  Acute Rehab PT Goals Patient Stated Goal: did not state PT Goal Formulation: With patient Time For Goal Achievement: 12/18/20 Potential to Achieve Goals: Fair    Frequency Min 3X/week   Barriers to discharge        Co-evaluation PT/OT/SLP Co-Evaluation/Treatment: Yes Reason for Co-Treatment: For patient/therapist safety;To address functional/ADL transfers PT goals addressed during session: Mobility/safety with mobility         AM-PAC PT "6 Clicks" Mobility  Outcome Measure Help needed turning from your back to your side while in a flat bed without using bedrails?: A Lot Help needed moving from lying on your back to sitting on the side of a flat bed without using bedrails?: A Lot Help needed moving to and from a bed to a chair (including a wheelchair)?: Total Help needed standing up from a chair using your arms (e.g., wheelchair or bedside chair)?: A Lot Help needed to walk in hospital room?: Total Help needed climbing 3-5 steps with a railing? : Total 6 Click Score: 9    End of Session Equipment Utilized During Treatment: Gait belt Activity Tolerance: Patient limited by pain Patient left: in chair;with call bell/phone within reach;with chair alarm set Nurse Communication: Mobility status PT Visit Diagnosis: Unsteadiness on feet (R26.81);Other abnormalities of gait and mobility (R26.89);Muscle weakness (generalized) (M62.81);History of falling (Z91.81)    Time: 0086-7619 PT Time Calculation (min) (ACUTE ONLY): 20 min   Charges:   PT Evaluation $PT Eval Moderate Complexity: 1 Mod          Gregor Hams, PT, DPT Acute  Rehabilitation Services Pager [phone] Office [phone]   [name] 12/04/2020, 11:29 AM

## 2020-12-04 NOTE — Progress Notes (Signed)
   12/04/2020 2105  Assess: MEWS Score  Temp (!) 97.4 F (36.3 C)  BP 136/77  Pulse Rate (!) 113  Resp 14  Level of Consciousness Alert  SpO2 91 %  O2 Device Room Air  Assess: MEWS Score  MEWS Temp 0  MEWS Systolic 0  MEWS Pulse 2  MEWS RR 0  MEWS LOC 0  MEWS Score 2  MEWS Score Color Yellow  Assess: if the MEWS score is Yellow or Red  Were vital signs taken at a resting state? Yes  Focused Assessment Change from prior assessment (see assessment flowsheet)  Early Detection of Sepsis Score *See Row Information* Low  MEWS guidelines implemented *See Row Information* Yes  Treat  MEWS Interventions Administered prn meds/treatments  Pain Scale PAINAD  Pain Score 2  Pain Location Hip  Pain Orientation Left  Take Vital Signs  Increase Vital Sign Frequency  Yellow: Q 2hr X 2 then Q 4hr X 2, if remains yellow, continue Q 4hrs  Escalate  MEWS: Escalate Yellow: discuss with charge nurse/RN and consider discussing with provider and RRT  Notify: Charge Nurse/RN  Name of Charge Nurse/RN Notified Sales promotion account executive RN  Date Charge Nurse/RN Notified 11/22/2020  Notify: Provider  Provider Name/Title n/a  Document  Patient Outcome Stabilized after interventions

## 2020-12-04 NOTE — Evaluation (Signed)
Occupational Therapy Evaluation Patient Details Name: HERMENEGILDO CLAUSEN MRN: [id number] DOB: 02-06-1926 Today's Date: 12/04/2020    History of Present Illness STCLAIR SZYMBORSKI is a 86 y.o. male with medical history significant of hypertension who presents with hip pain following a fall from standing height sustaining a L intertrochanteric femur fx. Patient s/p cephalomedullary L femur fx 12/23.   Clinical Impression   Mr. Dorrian Doggett is a 86 year old man s/p hip fracture repair with decreased ROM and strength of LLE, complaints of pain, decreased activity tolerance, impaired balance and confusion. Prior to admission patient living at [facility] and ambulating without a walker per chart - other PLOF unknown. On evaluation patient oriented only to self, max x 2 to perform bed transfers, standing and pivoting. Patient unable to use walker and take steps today. Patient max - total assist for LB ADLs and setup with verbal cuesf or UB ADLs. Patient will benefit from skilled OT services while in hospital to improve deficits and learn compensatory strategies as needed in order to improve functional abilities. Recommend short term rehab at discharge.    Follow Up Recommendations  SNF    Equipment Recommendations  None recommended by OT    Recommendations for Other Services       Precautions / Restrictions Precautions Precautions: Fall Restrictions Weight Bearing Restrictions: Yes LLE Weight Bearing: Weight bearing as tolerated      Mobility Bed Mobility Overal bed mobility: Needs Assistance Bed Mobility: Supine to Sit     Supine to sit: Max assist;+2 for physical assistance;+2 for safety/equipment     General bed mobility comments: assist for B LE advancement and trunk elevation    Transfers Overall transfer level: Needs assistance Equipment used: None;Rolling walker (2 wheeled) Transfers: Sit to/from [employer] Sit to Stand: Max assist;+2 physical  assistance;+2 safety/equipment Stand pivot transfers: +2 physical assistance;+2 safety/equipment;Total assist       General transfer comment: initially trialed RW but patient unable to problem solve use with cueing. Performed sit to stand with 2 person under arm assist and t/f to chair with stand pivot    Balance Overall balance assessment: Needs assistance;History of Falls Sitting-balance support: Single extremity supported;Feet supported Sitting balance-Leahy Scale: Poor Sitting balance - Comments: reliant on single UE support on foot rail. Postural control: Posterior lean Standing balance support: Bilateral upper extremity supported;During functional activity Standing balance-Leahy Scale: Poor Standing balance comment: heavy posterior lean with patient unaware                           ADL either performed or assessed with clinical judgement   ADL Overall ADL's : Needs assistance/impaired Eating/Feeding: Set up   Grooming: Wash/dry face;Sitting;Set up   Upper Body Bathing: Set up;Sitting;Cueing for sequencing   Lower Body Bathing: Maximal assistance;Sitting/lateral leans;Set up;Cueing for compensatory techniques   Upper Body Dressing : Minimal assistance;Cueing for sequencing;Set up;Sitting   Lower Body Dressing: Total assistance;+2 for physical assistance;+2 for safety/equipment;Sit to/from stand   Toilet Transfer: BSC;Stand-pivot;+2 for physical assistance;+2 for safety/equipment;Maximal assistance   Toileting- Architect and Hygiene: Total assistance;+2 for safety/equipment;+2 for physical assistance;Sit to/from stand               Vision   Vision Assessment?: No apparent visual deficits     Perception     Praxis      Pertinent Vitals/Pain Pain Assessment: Faces Faces Pain Scale: Hurts whole lot Pain Location: L hip Pain Descriptors /  Indicators: Grimacing;Guarding Pain Intervention(s): Monitored during session;Limited activity  within patient's tolerance (Recommended pain medicine prior to return to bed)     Hand Dominance     Extremity/Trunk Assessment Upper Extremity Assessment Upper Extremity Assessment: LUE deficits/detail;RUE deficits/detail RUE Deficits / Details: 3+/5 shoulder strength, 5/5 elbow, 5/5 wrist, 3+/5 grip LUE Deficits / Details: 3+/5 shoulder strength, 5/5 elbow, 5/5 wrist, 3+/5 grip   Lower Extremity Assessment Lower Extremity Assessment: Defer to PT evaluation   Cervical / Trunk Assessment Cervical / Trunk Assessment: Normal   Communication Communication Communication: HOH   Cognition Arousal/Alertness: Awake/alert Behavior During Therapy: WFL for tasks assessed/performed Overall Cognitive Status: No family/caregiver present to determine baseline cognitive functioning                                 General Comments: Oriented to self. Unable to state the year, the President, where he was or his situation. Kept stating he was in [employer].   General Comments       Exercises     Shoulder Instructions      Home Living Family/patient expects to be discharged to:: Other (Comment)                                 Additional Comments: ILF      Prior Functioning/Environment Level of Independence: Independent        Comments: Patient unable to answer PLOF questions.        OT Problem List: Decreased strength;Decreased range of motion;Decreased activity tolerance;Impaired balance (sitting and/or standing);Decreased cognition;Decreased safety awareness;Decreased knowledge of use of DME or AE;Decreased knowledge of precautions;Pain      OT Treatment/Interventions: Self-care/ADL training;Therapeutic exercise;DME and/or AE instruction;Therapeutic activities;Patient/family education;Balance training    OT Goals(Current goals can be found in the care plan section) Acute Rehab OT Goals Patient Stated Goal: did not state OT Goal Formulation: Patient  unable to participate in goal setting Time For Goal Achievement: 12/18/20 Potential to Achieve Goals: Good  OT Frequency: Min 2X/week   Barriers to D/C:            Co-evaluation PT/OT/SLP Co-Evaluation/Treatment: Yes Reason for Co-Treatment: For patient/therapist safety;To address functional/ADL transfers PT goals addressed during session: Mobility/safety with mobility OT goals addressed during session: ADL's and self-care      AM-PAC OT "6 Clicks" Daily Activity     Outcome Measure Help from another person eating meals?: A Little Help from another person taking care of personal grooming?: A Little Help from another person toileting, which includes using toliet, bedpan, or urinal?: Total Help from another person bathing (including washing, rinsing, drying)?: A Lot Help from another person to put on and taking off regular upper body clothing?: A Little Help from another person to put on and taking off regular lower body clothing?: Total 6 Click Score: 13   End of Session Equipment Utilized During Treatment: Gait belt;Rolling walker Nurse Communication: Mobility status  Activity Tolerance: Patient tolerated treatment well Patient left: in chair;with call bell/phone within reach;with chair alarm set  OT Visit Diagnosis: Unsteadiness on feet (R26.81);Other abnormalities of gait and mobility (R26.89);History of falling (Z91.81);Pain Pain - Right/Left: Left Pain - part of body: Hip                Time: 6283-6629 OT Time Calculation (min): 21 min Charges:  OT General Charges $OT Visit: 1 Visit  OT Evaluation $OT Eval Moderate Complexity: 1 Mod  Maley Venezia, OTR/L Acute Care Rehab Services  Office [phone] Pager: [phone]   Kelli Churn 12/04/2020, 11:43 AM

## 2020-12-04 NOTE — Progress Notes (Signed)
PROGRESS NOTE    Tom Robertson  FUX:[id number] DOB: 1926-11-05 DOA: Dec 30, 2020 PCP: Tom Man, NP    Brief Narrative:HPI per Dr. Beola Cord on 12/30/20 Tom Marus Milleris a 86 y.o.malewith medical history significant ofhypertensionwho presents with hip pain following a fall from standing height. Patient was reaching to pick up a newspaper from the ground and fell on his left side. He reports severe left hip pain which is worse with any movement. Pain is 10 out of 10 at its worst but is improved to moderate pain now.He is been unable to walk since the fall. He does walk with a cane at baseline. He denies any head trauma or taking any anticoagulation. He has 2 sons who the EDP spoke with who confirmed that he is on minimal medications only antihypertensive, aspirin and vitamins. He denies fever, cough, chest pain, shortness of breath, abdominal pain, constipation, diarrhea, nausea.  ED Course:Vitals in ED significant for intermittent hypertension in the 140s to 190s systolic. Some readings of low heart rate in the upper 50s. Lab work-up showed BMP that was normal, LFTs with calcium of 8.6which corrects considering albumin of 3.1, protein of 5.4. CBC within normal notes. Respiratory panel for flu and Covid pending. Patient was given pain medication and and orthopedics was consulted for his fracture. Chest x-ray was without acute disease. DG pelvis and DG left femur showed left intratrochanteric hip fracture. CT head was without acute process, but incidental hyperdense region noticed which may need follow-up MRI. CT C-spine showed multilevel spondylosis but no acute fracture. CT abdomen redemonstrated left femoral fracture otherwise showed no acute abdominal or pelvic process diverticulosis without diverticulitis noted  Assessment & Plan:   Principal Problem:   Closed comminuted intertrochanteric fracture of left femur (HCC) Active Problems:   Fall at  home, initial encounter   HTN (hypertension)   Osteoporosis    #1 status post mechanical fall with intertrochanteric fracture of the left femur status post surgery At baseline he walks with a cane PT OT eval Pain control Bowel regimen Tylenol 3 times daily  #2 history of essential hypertension was on lisinopril at home.  His blood pressure is running soft at 110/70.  We'll hold off on restarting lisinopril.  Continue as needed hydralazine.   #3 hyperglycemia likely stress-induced or from IV fluids he has no history of diabetes.  Hemoglobin A1c is 5.7  #4 hyperlipidemia on statin  #5 abnormal CT head with a 1.7 cm hypodense region in the left frontal white matter that could reflect vascular malformation or dysplasia.  Nonemergent MRI with and without contrast is recommended.  Will discuss with family prior to ordering MRI.   #6 goals of care patient is DNR on admission. There is no height or weight on file to calculate BMI.  DVT prophylaxis: Lovenox Code Status: DO NOT RESUSCITATE Family Communication: Called son Tom Robertson was not able to speak to him disposition Plan:  Status is: Inpatient  Dispo: The patient is from: ALF              Anticipated d/c is to: SNF              Anticipated d/c date is: 3 days              Patient currently is not medically stable to d/c.   Consultants:   Ortho  Procedures: Left hip surgery Antimicrobials none  Subjective: Patient is resting in bed he is awake he is alert staff reported he took  off the dressing from the left hip. No nausea vomiting diarrhea constipation reported  Objective: Vitals:   12/05/2020 2243 12/04/20 0105 12/04/20 0355 12/04/20 0803  BP: 127/73 126/78 135/79 110/67  Pulse: (!) 105 91 96 91  Resp: Temp: 97.8 F (36.6 C) 97.8 F (36.6 C) 97.9 F (36.6 C) (!) 97.5 F (36.4 C)  TempSrc: Axillary Oral Oral Oral  SpO2: 92% 96% 100% 99%    Intake/Output Summary (Last 24 hours) at 12/04/2020 1117 Last  data filed at 12/04/2020 0500 Gross per 24 hour  Intake 2010 ml  Output 100 ml  Net 1910 ml   There were no vitals filed for this visit.  Examination:  General exam: Appears calm and comfortable  Respiratory system: Clear to auscultation. Respiratory effort normal. Cardiovascular system: S1 & S2 heard, RRR. No JVD, murmurs, rubs, gallops or clicks. No pedal edema. Gastrointestinal system: Abdomen is nondistended, soft and nontender. No organomegaly or masses felt. Normal bowel sounds heard. Central nervous system: Alert and oriented. No focal neurological deficits. Extremities: Symmetric 5 x 5 power.  Left hip incision covered by dressing. Skin: No rashes, lesions or ulcers Psychiatry: Judgement and insight appear normal. Mood & affect appropriate.     Data Reviewed: I have personally reviewed following labs and imaging studies  CBC: Recent Labs  Lab 11/17/2020 1700 11/30/2020 0530 12/04/20 0311  WBC 5.2 9.0 15.5*  NEUTROABS 4.1  --  13.8*  HGB 14.2 13.4 11.4*  HCT 43.1 41.5 36.1*  MCV 98.4 98.3 100.6*  PLT 154 145* 139*   Basic Metabolic Panel: Recent Labs  Lab 11/18/2020 1700 12/11/2020 0530 12/04/20 0311  NA 141 140 141  K 3.9 4.1 4.0  CL 103 102 103  CO2 GLUCOSE 101* 136* 141*  BUN 18 16 25*  CREATININE 1.17 1.17 1.71*  CALCIUM 8.5* 8.6* 8.3*  MG  --   --  1.6*  PHOS  --   --  4.5   GFR: CrCl cannot be calculated (Unknown ideal weight.). Liver Function Tests: Recent Labs  Lab 12/07/2020 1700 12/04/20 0311  AST 26 52*  ALT 18 21  ALKPHOS 110 72  BILITOT 0.9 0.9  PROT 5.4* 5.1*  ALBUMIN 3.1* 2.9*   No results for input(s): LIPASE, AMYLASE in the last 168 hours. No results for input(s): AMMONIA in the last 168 hours. Coagulation Profile: Recent Labs  Lab 12/07/2020 0530  INR 1.1   Cardiac Enzymes: No results for input(s): CKTOTAL, CKMB, CKMBINDEX, TROPONINI in the last 168 hours. BNP (last 3 results) No results for input(s): PROBNP in the  last 8760 hours. HbA1C: Recent Labs    12/04/20 0311  HGBA1C 5.7*   CBG: Recent Labs  Lab 11/22/2020 2108  GLUCAP 136*   Lipid Profile: No results for input(s): CHOL, HDL, LDLCALC, TRIG, CHOLHDL, LDLDIRECT in the last 72 hours. Thyroid Function Tests: No results for input(s): TSH, T4TOTAL, FREET4, T3FREE, THYROIDAB in the last 72 hours. Anemia Panel: No results for input(s): VITAMINB12, FOLATE, FERRITIN, TIBC, IRON, RETICCTPCT in the last 72 hours. Sepsis Labs: No results for input(s): PROCALCITON, LATICACIDVEN in the last 168 hours.  Recent Results (from the past 240 hour(s))  Resp Panel by RT-PCR (Flu A&B, Covid) Nasopharyngeal Swab     Status: None   Collection Time: 11/14/2020  3:52 PM   Specimen: Nasopharyngeal Swab; Nasopharyngeal(NP) swabs in vial transport medium  Result Value Ref Range Status   SARS Coronavirus 2 by RT PCR  NEGATIVE NEGATIVE Final    Comment: (NOTE) SARS-CoV-2 target nucleic acids are NOT DETECTED.  The SARS-CoV-2 RNA is generally detectable in upper respiratory specimens during the acute phase of infection. The lowest concentration of SARS-CoV-2 viral copies this assay can detect is 138 copies/mL. A negative result does not preclude SARS-Cov-2 infection and should not be used as the sole basis for treatment or other patient management decisions. A negative result may occur with  improper specimen collection/handling, submission of specimen other than nasopharyngeal swab, presence of viral mutation(s) within the areas targeted by this assay, and inadequate number of viral copies(<138 copies/mL). A negative result must be combined with clinical observations, patient history, and epidemiological information. The expected result is Negative.  Fact Sheet for Patients:  BloggerCourse.com  Fact Sheet for Healthcare Providers:  SeriousBroker.it  This test is no t yet approved or cleared by the [place] FDA and  has been authorized for detection and/or diagnosis of SARS-CoV-2 by FDA under an Emergency Use Authorization (EUA). This EUA will remain  in effect (meaning this test can be used) for the duration of the COVID-19 declaration under Section 564(b)(1) of the Act, 21 U.S.C.section 360bbb-3(b)(1), unless the authorization is terminated  or revoked sooner.       Influenza A by PCR NEGATIVE NEGATIVE Final   Influenza B by PCR NEGATIVE NEGATIVE Final    Comment: (NOTE) The Xpert Xpress SARS-CoV-2/FLU/RSV plus assay is intended as an aid in the diagnosis of influenza from Nasopharyngeal swab specimens and should not be used as a sole basis for treatment. Nasal washings and aspirates are unacceptable for Xpert Xpress SARS-CoV-2/FLU/RSV testing.  Fact Sheet for Patients: BloggerCourse.com  Fact Sheet for Healthcare Providers: SeriousBroker.it  This test is not yet approved or cleared by the [place] FDA and has been authorized for detection and/or diagnosis of SARS-CoV-2 by FDA under an Emergency Use Authorization (EUA). This EUA will remain in effect (meaning this test can be used) for the duration of the COVID-19 declaration under Section 564(b)(1) of the Act, 21 U.S.C. section 360bbb-3(b)(1), unless the authorization is terminated or revoked.  Performed at [facility], [address], [place], [place] [address]          Radiology Studies: CT Head Wo Contrast  Result Date: 11/24/2020 CLINICAL DATA:  Larey Seat from standing EXAM: CT HEAD WITHOUT CONTRAST TECHNIQUE: Contiguous axial images were obtained from the base of the skull through the vertex without intravenous contrast. COMPARISON:  None. FINDINGS: Brain: Confluent hypodensities throughout the periventricular and subcortical white matter are compatible with chronic small vessel ischemic changes. Hyperdense area in the left frontal periventricular white  matter measuring approximately 1.7 cm is indeterminate, but could reflect an area of cortical dysplasia or vascular malformation. Follow-up nonemergent MRI may be useful to better characterize and exclude underlying mass. No evidence of acute infarct or hemorrhage. Lateral ventricles and remaining midline structures are unremarkable. No acute extra-axial fluid collections. No mass effect. Diffuse age-appropriate cerebral atrophy. Vascular: No evidence of hyperdense vessel. Mild atherosclerosis. 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 Skull: Normal. Negative for fracture or focal lesion. Sinuses/Orbits: No acute finding. Other: None. IMPRESSION: 1. No acute intracranial process. 2. Extensive chronic small-vessel ischemic changes throughout the white matter. 3. Likely incidental 1.7 cm hyperdense region in the left frontal white matter, which could reflect an area of dysplasia or vascular malformation. Follow-up nonemergent MRI with and without contrast may be useful to better characterize and exclude underlying mass  lesion. Electronically Signed   By: Sharlet SalinaMichael  Brown M.D.   On: 11/23/2020 18:53   CT Cervical Spine Wo Contrast  Result Date: 11/30/2020 CLINICAL DATA:  Larey SeatFell from standing, left hip fracture EXAM: CT CERVICAL SPINE WITHOUT CONTRAST TECHNIQUE: Multidetector CT imaging of the cervical spine was performed without intravenous contrast. Multiplanar CT image reconstructions were also generated. COMPARISON:  None. FINDINGS: Alignment: Alignment is anatomic. Skull base and vertebrae: No acute fracture. No primary bone lesion or focal pathologic process. Soft tissues and spinal canal: No prevertebral fluid or swelling. No visible canal hematoma. Disc levels: There is diffuse cervical spondylosis. Mild neural foraminal narrowing is seen at C3-4 and C4-5. No evidence of central canal stenosis. Upper chest: Airway is patent.  Lung apices are clear. Other: Reconstructed images  demonstrate no additional findings. IMPRESSION: 1. Extensive multilevel cervical spondylosis. No acute displaced fracture. Electronically Signed   By: Sharlet SalinaMichael  Brown M.D.   On: 12/01/2020 18:44   CT ABDOMEN PELVIS W CONTRAST  Result Date: 11/30/2020 CLINICAL DATA:  Left lower quadrant abdominal pain, recent fall, left hip fracture EXAM: CT ABDOMEN AND PELVIS WITH CONTRAST TECHNIQUE: Multidetector CT imaging of the abdomen and pelvis was performed using the standard protocol following bolus administration of intravenous contrast. CONTRAST:  100mL OMNIPAQUE IOHEXOL 300 MG/ML  SOLN COMPARISON:  None. FINDINGS: Lower chest: No acute pleural or parenchymal lung disease. Dense calcification of the mitral annulus. Hepatobiliary: Small calcified gallstones without cholecystitis. No evidence of liver injury. Pancreas: Unremarkable. No pancreatic ductal dilatation or surrounding inflammatory changes. Spleen: Multiple small hypodensities in the spleen likely reflects cysts or hemangiomas. No evidence of splenic injury. Adrenals/Urinary Tract: There is bilateral renal cortical atrophy. No evidence of renal injury. Bilateral renal cortical cysts are noted. No nephrolithiasis or obstructive uropathy. The bladder and adrenals are unremarkable. Stomach/Bowel: No bowel obstruction or ileus. There is diverticulosis of the distal colon with no evidence of acute diverticulitis. There is a short segment of wall thickening within the mid sigmoid colon reference image 52, without surrounding inflammatory change. This may be due to under distension or scarring from previous bouts of diverticulitis. If patient has bowel symptoms,, follow-up nonemergent colonoscopy or barium enema could be considered. Vascular/Lymphatic: Aortic atherosclerosis. No enlarged abdominal or pelvic lymph nodes. Reproductive: Prostate is likely surgically absent. Other: No free fluid or free intraperitoneal gas. No abdominal wall hernia. Musculoskeletal: The  comminuted intertrochanteric fracture seen on previous imaging is again identified, with impaction and mild varus angulation. No dislocation. No other acute displaced fractures. Reconstructed images demonstrate no additional findings. IMPRESSION: 1. Comminuted impacted left femoral neck fracture. 2. Otherwise no acute intra-abdominal or intrapelvic trauma. 3. Diverticulosis of the colon without evidence of acute diverticulitis. Segmental wall thickening of the sigmoid colon may be due to scarring or under distension. 4. Cholelithiasis without cholecystitis. 5.  Aortic Atherosclerosis (ICD10-I70.0). Electronically Signed   By: Sharlet SalinaMichael  Brown M.D.   On: 11/17/2020 19:03   DG Pelvis Portable  Result Date: 11/30/2020 CLINICAL DATA:  Larey SeatFell, left leg deformity EXAM: PORTABLE PELVIS 1-2 VIEWS COMPARISON:  None. FINDINGS: Single frontal view of the pelvis demonstrates an intertrochanteric left hip fracture with mild varus angulation and significant distraction of the fracture fragments. No dislocation. No additional bony abnormalities. Mild symmetrical hip osteoarthritis. Diffuse lower lumbar spondylosis. Extensive vascular calcifications. IMPRESSION: 1. Comminuted intertrochanteric left hip fracture with varus angulation. Electronically Signed   By: Sharlet SalinaMichael  Brown M.D.   On: 11/29/2020 17:19   DG Chest Portable 1 View  Result  Date: 11/26/2020 CLINICAL DATA:  Larey Seat, left hip fracture EXAM: PORTABLE CHEST 1 VIEW COMPARISON:  None. FINDINGS: Single frontal view of the chest demonstrates an unremarkable cardiac silhouette. No airspace disease, effusion, or pneumothorax. No acute bony abnormalities. IMPRESSION: 1. No acute intrathoracic process. Electronically Signed   By: Sharlet Salina M.D.   On: 11/22/2020 17:19   DG C-Arm 1-60 Min  Result Date: 12/07/2020 CLINICAL DATA:  Likely surgery, intramedullary nail of the left femur. EXAM: DG HIP (WITH OR WITHOUT PELVIS) 2-3V LEFT; DG C-ARM 1-60 MIN COMPARISON:   Radiographs December 02, 2020. FINDINGS: Fluoro time: 1 minutes and 24 seconds. Radiation 14.02 mGy. Seven C-arm fluoroscopic images were obtained intraoperatively and submitted for post operative interpretation. These images demonstrate intramedullary nail fixation of a left intertrochanteric femur fracture. Please see the performing provider's procedural report for further detail. IMPRESSION: Intraoperative fluoroscopic images, as detailed above. Electronically Signed   By: Feliberto Harts MD   On: 11/14/2020 15:42   DG HIP PORT UNILAT W OR W/O PELVIS 1V LEFT  Result Date: 11/27/2020 CLINICAL DATA:  Status post left hip fixation EXAM: DG HIP (WITH OR WITHOUT PELVIS) 1V PORT LEFT COMPARISON:  Intraoperative films from earlier in the same day. FINDINGS: Previously seen proximal left femoral fracture is again noted. Short medullary rod with fixation screws is now seen similar to that noted on prior operative films. Fracture fragments are in near anatomic alignment. IMPRESSION: Status post ORIF proximal left femoral fracture Electronically Signed   By: Alcide Clever M.D.   On: 11/14/2020 16:25   DG HIP UNILAT WITH PELVIS 2-3 VIEWS LEFT  Result Date: 11/22/2020 CLINICAL DATA:  Likely surgery, intramedullary nail of the left femur. EXAM: DG HIP (WITH OR WITHOUT PELVIS) 2-3V LEFT; DG C-ARM 1-60 MIN COMPARISON:  Radiographs December 02, 2020. FINDINGS: Fluoro time: 1 minutes and 24 seconds. Radiation 14.02 mGy. Seven C-arm fluoroscopic images were obtained intraoperatively and submitted for post operative interpretation. These images demonstrate intramedullary nail fixation of a left intertrochanteric femur fracture. Please see the performing provider's procedural report for further detail. IMPRESSION: Intraoperative fluoroscopic images, as detailed above. Electronically Signed   By: Feliberto Harts MD   On: 11/24/2020 15:42   DG Femur Portable 1 View Left  Result Date: 11/28/2020 CLINICAL DATA:  Larey Seat,  externally rotated left hip with obvious deformity EXAM: LEFT FEMUR PORTABLE 1 VIEW COMPARISON:  None. FINDINGS: 2 frontal views. There is a comminuted of the left femur are obtained intertrochanteric left hip fracture with varus angulation. The distal aspect of the femur is externally rotated relative to the fracture site. There is no dislocation. Extensive vascular calcifications are seen. IMPRESSION: 1. Comminuted intertrochanteric left hip fracture. Electronically Signed   By: Sharlet Salina M.D.   On: 12/05/2020 17:18        Scheduled Meds: . enoxaparin (LOVENOX) injection  40 mg Subcutaneous Q24H   Continuous Infusions: . sodium chloride 75 mL/hr at 12/04/20 0633  .  ceFAZolin (ANCEF) IV 2 g (12/04/20 7096)     LOS: 2 days     Alwyn Ren, MD  12/04/2020, 11:17 AM

## 2020-12-05 DIAGNOSIS — S72142A Displaced intertrochanteric fracture of left femur, initial encounter for closed fracture: Secondary | ICD-10-CM | POA: Diagnosis not present

## 2020-12-05 LAB — CBC WITH DIFFERENTIAL/PLATELET
Abs Immature Granulocytes: 0.06 10*3/uL (ref 0.00–0.07)
Basophils Absolute: 0 10*3/uL (ref 0.0–0.1)
Basophils Relative: 0 %
Eosinophils Absolute: 0 10*3/uL (ref 0.0–0.5)
Eosinophils Relative: 0 %
HCT: 30.3 % — ABNORMAL LOW (ref 39.0–52.0)
Hemoglobin: 10 g/dL — ABNORMAL LOW (ref 13.0–17.0)
Immature Granulocytes: 1 %
Lymphocytes Relative: 4 %
Lymphs Abs: 0.5 10*3/uL — ABNORMAL LOW (ref 0.7–4.0)
MCH: 32.2 pg (ref 26.0–34.0)
MCHC: 33 g/dL (ref 30.0–36.0)
MCV: 97.4 fL (ref 80.0–100.0)
Monocytes Absolute: 1.2 10*3/uL — ABNORMAL HIGH (ref 0.1–1.0)
Monocytes Relative: 9 %
Neutro Abs: 10.7 10*3/uL — ABNORMAL HIGH (ref 1.7–7.7)
Neutrophils Relative %: 86 %
Platelets: 134 10*3/uL — ABNORMAL LOW (ref 150–400)
RBC: 3.11 MIL/uL — ABNORMAL LOW (ref 4.22–5.81)
RDW: 13.2 % (ref 11.5–15.5)
WBC: 12.5 10*3/uL — ABNORMAL HIGH (ref 4.0–10.5)
nRBC: 0 % (ref 0.0–0.2)

## 2020-12-05 LAB — COMPREHENSIVE METABOLIC PANEL
ALT: 13 U/L (ref 0–44)
AST: 86 U/L — ABNORMAL HIGH (ref 15–41)
Albumin: 3 g/dL — ABNORMAL LOW (ref 3.5–5.0)
Alkaline Phosphatase: 69 U/L (ref 38–126)
Anion gap: 13 (ref 5–15)
BUN: 36 mg/dL — ABNORMAL HIGH (ref 8–23)
CO2: 25 mmol/L (ref 22–32)
Calcium: 8.4 mg/dL — ABNORMAL LOW (ref 8.9–10.3)
Chloride: 102 mmol/L (ref 98–111)
Creatinine, Ser: 1.63 mg/dL — ABNORMAL HIGH (ref 0.61–1.24)
GFR, Estimated: 39 mL/min — ABNORMAL LOW (ref >60)
Glucose, Bld: 116 mg/dL — ABNORMAL HIGH (ref 70–99)
Potassium: 4.3 mmol/L (ref 3.5–5.1)
Sodium: 140 mmol/L (ref 135–145)
Total Bilirubin: 1 mg/dL (ref 0.3–1.2)
Total Protein: 5.4 g/dL — ABNORMAL LOW (ref 6.5–8.1)

## 2020-12-05 MED ORDER — METOPROLOL TARTRATE 12.5 MG HALF TABLET
12.5000 mg | ORAL_TABLET | Freq: Two times a day (BID) | ORAL | Status: DC
  Administered 2020-12-05 – 2020-12-11 (×12): 12.5 mg via ORAL
  Filled 2020-12-05 (×13): qty 1

## 2020-12-05 MED ORDER — HYDRALAZINE HCL 20 MG/ML IJ SOLN
5.0000 mg | Freq: Four times a day (QID) | INTRAMUSCULAR | Status: DC | PRN
  Administered 2020-12-09: 17:00:00 5 mg via INTRAVENOUS
  Filled 2020-12-05: qty 1

## 2020-12-05 NOTE — Progress Notes (Signed)
    Subjective: Patient reports pain as mild.  Tolerating diet.  Urinating.  +Flatus.  No CP, SOB.   OOB to chair  Objective:   VITALS:   Vitals:   12/04/20 1600 12/04/20 1927 12/05/20 0520 12/05/20 0700  BP:  140/71 (!) 156/79 (!) 161/89  Pulse:  79 94 86  Resp:  16  17  Temp:  98.1 F (36.7 C) (!) 97.5 F (36.4 C) 97.8 F (36.6 C)  TempSrc:  Oral Oral Oral  SpO2:  95% 92% 96%  Weight: 61.9 kg     Height: 5\' 9"  (1.753 m)      CBC Latest Ref Rng & Units 12/05/2020 12/04/2020 Dec 25, 2020  WBC 4.0 - 10.5 K/uL 12.5(H) 15.5(H) 9.0  Hemoglobin 13.0 - 17.0 g/dL 10.0(L) 11.4(L) 13.4  Hematocrit 39.0 - 52.0 % 30.3(L) 36.1(L) 41.5  Platelets 150 - 400 K/uL 134(L) 139(L) 145(L)   BMP Latest Ref Rng & Units 12/05/2020 12/04/2020 25-Dec-2020  Glucose 70 - 99 mg/dL 12/05/2020) 664(Q) 034(V)  BUN 8 - 23 mg/dL 425(Z) 56(L) 16  Creatinine 0.61 - 1.24 mg/dL 87(F) 6.43(P) 2.95(J  Sodium 135 - 145 mmol/L 140 141 140  Potassium 3.5 - 5.1 mmol/L 4.3 4.0 4.1  Chloride 98 - 111 mmol/L 102 103 102  CO2 22 - 32 mmol/L 25 22 25   Calcium 8.9 - 10.3 mg/dL 8.84) 8.3(L) 8.6(L)   Intake/Output      12/24 0701 12/25 0700 12/25 0701 12/26 0700   P.O. 120    I.V. (mL/kg) 539.3 (8.7)    IV Piggyback 300    Total Intake(mL/kg) 959.3 (15.5)    Urine (mL/kg/hr)     Blood     Total Output     Net +959.3            Physical Exam: General: NAD.  Resting comfortably in chair Resp: No increased wob Cardio: regular rate and rhythm ABD soft Neurologically intact MSK Neurovascularly intact Sensation intact distally Intact pulses distally Dorsiflexion/Plantar flexion intact Incision: dressing C/D/I Neurovascular intact Compartment soft    Assessment: 2 Days Post-Op  S/P Procedure(s) (LRB): INTRAMEDULLARY (IM) NAIL INTERTROCHANTRIC (Left) by Dr. 1/26 on 2020/12/25  Principal Problem:   Closed comminuted intertrochanteric fracture of left femur (HCC) Active Problems:   Fall at home, initial  encounter   HTN (hypertension)   Osteoporosis      Plan: Advance diet Up with therapy D/C IV fluids Discharge to SNF Incentive Spirometry Apply ice   Weight Bearing: Weight Bearing as Tolerated (WBAT) LLE Dressings: Maintain Mepilex.   VTE prophylaxis: Lovenox, SCDs, ambulation Dispo: Skilled Nursing Facility/Rehab  When cleared by PT/OT   Jena Gauss, PA-C 12/05/2020, 9:50 AM

## 2020-12-05 NOTE — TOC Initial Note (Signed)
Transition of Care [facility]) - Initial/Assessment Note    Patient Details  Name: Tom Robertson MRN: [id number] Date of Birth: September 12, 1926  [facility] CM/SW Contact:    Lockie Pares, RN Phone Number: 12/05/2020, 12:43 PM  Clinical Narrative:                  Patient stopped down to pick up a newspaper and fell fracturing femur, nail repair done. Patient is not happy at present wishes to return to Independent living, however needs SNF. Spoke to son [name], who understands that patient will have to go to rehab with the goal of Independent living after rehab Would like nearer to other son in [place] area. They are aware of Medicare. gov when choices need to be made. D/w CSW.  Expected Discharge Plan: Skilled Nursing Facility Barriers to Discharge: Continued Medical Work up   Patient Goals and CMS Choice        Expected Discharge Plan and Services Expected Discharge Plan: Skilled Nursing Facility In-house Referral: Clinical Social Work Discharge Planning Services: CM Consult   Living arrangements for the past 2 months: Independent Living Facility                                      Prior Living Arrangements/Services Living arrangements for the past 2 months: Independent Living Facility Lives with:: Self Patient language and need for interpreter reviewed:: Yes        Need for Family Participation in Patient Care: Yes (Comment) Care giver support system in place?: Yes (comment) Current home services: DME (uses cane) Criminal Activity/Legal Involvement Pertinent to Current Situation/Hospitalization: No - Comment as needed  Activities of Daily Living      Permission Sought/Granted                  Emotional Assessment       Orientation: : Fluctuating Orientation (Suspected and/or reported Sundowners) Alcohol / Substance Use: Not Applicable Psych Involvement: No (comment)  Admission diagnosis:  Closed displaced intertrochanteric  fracture of left femur, initial encounter (HCC) [S72.142A] Fall, initial encounter [W19.XXXA] Closed comminuted intertrochanteric fracture of left femur (HCC) [S72.142A] Patient Active Problem List   Diagnosis Date Noted  . Closed comminuted intertrochanteric fracture of left femur (HCC) 2020/12/24  . Fall at home, initial encounter 24-Dec-2020  . HTN (hypertension) 2020/12/24  . Osteoporosis Dec 24, 2020   PCP:  Joycelyn Man, NP Pharmacy:  No Pharmacies Listed    Social Determinants of Health (SDOH) Interventions    Readmission Risk Interventions No flowsheet data found.

## 2020-12-05 NOTE — [place] FL2 (Signed)
  [place] MEDICAID FL2 LEVEL OF CARE SCREENING TOOL     IDENTIFICATION  Patient Name: Tom Robertson Birthdate: 1926-10-02 Sex: male Admission Date (Current Location): 12-29-20  [facility] and [place] Number:  Producer, television/film/video and Address:  [facility], [address], [place], [place] [address]      Provider Number: [id number]  Attending Physician Name and Address:  Alwyn Ren, MD  Relative Name and Phone Number:  Corrie Brannen [phone]    Current Level of Care: Hospital Recommended Level of Care: Skilled Nursing Facility Prior Approval Number:    Date Approved/Denied:   PASRR Number:    Discharge Plan: SNF    Current Diagnoses: Patient Active Problem List   Diagnosis Date Noted  . Closed comminuted intertrochanteric fracture of left femur (HCC) 12-29-20  . Fall at home, initial encounter Dec 29, 2020  . HTN (hypertension) 2020-12-29  . Osteoporosis 12-29-20    Orientation RESPIRATION BLADDER Height & Weight     Self  Normal External catheter Weight: 136 lb 7.4 oz (61.9 kg) Height:  5\' 9"  (175.3 cm)  BEHAVIORAL SYMPTOMS/MOOD NEUROLOGICAL BOWEL NUTRITION STATUS      Incontinent Diet (See DC Summary)  AMBULATORY STATUS COMMUNICATION OF NEEDS Skin   Extensive Assist Verbally Surgical wounds (SI L Hip w/ Foam Dressing)                       Personal Care Assistance Level of Assistance  Bathing,Feeding,Dressing Bathing Assistance: Maximum assistance Feeding assistance: Limited assistance Dressing Assistance: Maximum assistance     Functional Limitations Info  Sight,Hearing,Speech Sight Info: Impaired Hearing Info: Adequate Speech Info: Adequate    SPECIAL CARE FACTORS FREQUENCY  PT (By licensed PT),OT (By licensed OT)     PT Frequency: 5x week OT Frequency: 5x week            Contractures Contractures Info: Not present    Additional Factors Info  Code Status,Allergies Code Status Info:  DNR Allergies Info: NKA           Current Medications (12/05/2020):  This is the current hospital active medication list Current Facility-Administered Medications  Medication Dose Route Frequency Provider Last Rate Last Admin  . 0.9 %  sodium chloride infusion   Intravenous Continuous 12/07/2020, PA-C 75 mL/hr at 12/04/20 12/06/20 New Bag at 12/04/20 12/06/20  . acetaminophen (TYLENOL) tablet 650 mg  650 mg Oral Q6H PRN 0630, MD   650 mg at 12/04/20 1234  . enoxaparin (LOVENOX) injection 30 mg  30 mg Subcutaneous Q24H 12/06/20, Ilda Basset   30 mg at 12/05/20 1107  . HYDROcodone-acetaminophen (NORCO/VICODIN) 5-325 MG per tablet 1 tablet  1 tablet Oral Q6H PRN 12/07/20, PA-C      . HYDROmorphone (DILAUDID) injection 0.5 mg  0.5 mg Intravenous Q4H PRN Despina Hidden A, PA-C   0.5 mg at 11/21/2020 1150  . polyethylene glycol (MIRALAX / GLYCOLAX) packet 17 g  17 g Oral Daily PRN 12/05/20 A, PA-C      . polyethylene glycol (MIRALAX / GLYCOLAX) packet 17 g  17 g Oral Daily Ulyses Southward, MD   17 g at 12/05/20 1107     Discharge Medications: Please see discharge summary for a list of discharge medications.  Relevant Imaging Results:  Relevant Lab Results:   Additional Information SS# [id number] [address]

## 2020-12-05 NOTE — Plan of Care (Signed)
  Problem: Safety: Goal: Ability to remain free from injury will improve Outcome: Progressing   

## 2020-12-05 NOTE — Plan of Care (Signed)
Patient is s/p left IM nailing on 12/23. Mepilex to left lower ext clean dry and intact. Patient does continue to be a little restless but is pleasant and is not trying to get out of bed. Waiting SNF placement at this time. Will continue to monitor and continue current POC.

## 2020-12-05 NOTE — Social Work (Signed)
     RE:     Date of Birth:   ___________  Date:        To Whom It May Concern:  Please be advised that the above-named patient will require a short-term nursing home stay - anticipated 30 days or less for rehabilitation and strengthening.  The plan is for return home.     

## 2020-12-05 NOTE — Progress Notes (Signed)
Notified Dr. Ashley Royalty about pt's blood pressure of 169/71. New orders placed. Will continue to monitor.

## 2020-12-05 NOTE — Progress Notes (Signed)
PROGRESS NOTE    Tom Robertson  LNL:[id number] DOB: Apr 12, 1926 DOA: 12/10/2020 PCP: Tom Man, NP    Brief Narrative:HPI per Dr. Beola Cord on 11/30/2020 Tom Robertson a 86 y.o.malewith medical history significant ofhypertensionwho presents with hip pain following a fall from standing height. Patient was reaching to pick up a newspaper from the ground and fell on his left side. He reports severe left hip pain which is worse with any movement. Pain is 10 out of 10 at its worst but is improved to moderate pain now.He is been unable to walk since the fall. He does walk with a cane at baseline. He denies any head trauma or taking any anticoagulation. He has 2 sons who the EDP spoke with who confirmed that he is on minimal medications only antihypertensive, aspirin and vitamins. He denies fever, cough, chest pain, shortness of breath, abdominal pain, constipation, diarrhea, nausea.  ED Course:Vitals in ED significant for intermittent hypertension in the 140s to 190s systolic. Some readings of low heart rate in the upper 50s. Lab work-up showed BMP that was normal, LFTs with calcium of 8.6which corrects considering albumin of 3.1, protein of 5.4. CBC within normal notes. Respiratory panel for flu and Covid pending. Patient was given pain medication and and orthopedics was consulted for his fracture. Chest x-ray was without acute disease. DG pelvis and DG left femur showed left intratrochanteric hip fracture. CT head was without acute process, but incidental hyperdense region noticed which may need follow-up MRI. CT C-spine showed multilevel spondylosis but no acute fracture. CT abdomen redemonstrated left femoral fracture otherwise showed no acute abdominal or pelvic process diverticulosis without diverticulitis noted  Assessment & Plan:   Principal Problem:   Closed comminuted intertrochanteric fracture of left femur (HCC) Active Problems:   Fall at  home, initial encounter   HTN (hypertension)   Osteoporosis    #1 status post mechanical fall with intertrochanteric fracture of the left femur status post surgery At baseline he walks with a cane PT OT eval recommending SNF. Pain control Bowel regimen Tylenol 3 times daily Weightbearing as tolerated.  #2 history of essential hypertension was on lisinopril at home.  His blood pressure is running soft at 110/70.  We'll hold off on restarting lisinopril.  Continue as needed hydralazine.   #3 hyperglycemia likely stress-induced or from IV fluids he has no history of diabetes.  Hemoglobin A1c is 5.7  #4 hyperlipidemia on statin  #5 abnormal CT head with a 1.7 cm hypodense region in the left frontal white matter that could reflect vascular malformation or dysplasia.  Nonemergent MRI with and without contrast is recommended.  Will discuss with family prior to ordering MRI.   #6 goals of care patient is DNR on admission. Estimated body mass index is 20.15 kg/m as calculated from the following:   Height as of this encounter: 5\' 9"  (1.753 m).   Weight as of this encounter: 61.9 kg.  DVT prophylaxis: Lovenox Code Status: DO NOT RESUSCITATE Family Communication: Called son was not able to speak to him disposition Plan:  Status is: Inpatient  Dispo: The patient is from: ALF              Anticipated d/c is to: SNF              Anticipated d/c date is: 3 days              Patient currently is not medically stable to d/c.   Consultants:  Ortho  Procedures: Left hip surgery Antimicrobials none  Subjective: Patient resting in bed in no acute distress awake alert oriented answer all my questions appropriately.  No events overnight. Objective: Vitals:   12/04/20 1600 12/04/20 1927 12/05/20 0520 12/05/20 0700  BP:  140/71 (!) 156/79 (!) 161/89  Pulse:  79 94 86  Resp:  16  17  Temp:  98.1 F (36.7 C) (!) 97.5 F (36.4 C) 97.8 F (36.6 C)  TempSrc:  Oral Oral Oral  SpO2:   95% 92% 96%  Weight: 61.9 kg     Height: 5\' 9"  (1.753 m)       Intake/Output Summary (Last 24 hours) at 12/05/2020 1135 Last data filed at 12/04/2020 1700 Gross per 24 hour  Intake 959.25 ml  Output --  Net 959.25 ml   Filed Weights   12/04/20 1600  Weight: 61.9 kg    Examination:  General exam: Appears calm and comfortable  Respiratory system: Clear to auscultation. Respiratory effort normal. Cardiovascular system: S1 & S2 heard, RRR. No JVD, murmurs, rubs, gallops or clicks. No pedal edema. Gastrointestinal system: Abdomen is nondistended, soft and nontender. No organomegaly or masses felt. Normal bowel sounds heard. Central nervous system: Alert and oriented. No focal neurological deficits. Extremities: Symmetric 5 x 5 power.  Left hip incision covered by dressing. Skin: No rashes, lesions or ulcers Psychiatry: Judgement and insight appear normal. Mood & affect appropriate.     Data Reviewed: I have personally reviewed following labs and imaging studies  CBC: Recent Labs  Lab 06-Apr-2020 1700 12/04/2020 0530 12/04/20 0311 12/05/20 0221  WBC 5.2 9.0 15.5* 12.5*  NEUTROABS 4.1  --  13.8* 10.7*  HGB 14.2 13.4 11.4* 10.0*  HCT 43.1 41.5 36.1* 30.3*  MCV 98.4 98.3 100.6* 97.4  PLT 154 145* 139* 134*   Basic Metabolic Panel: Recent Labs  Lab 06-Apr-2020 1700 11/20/2020 0530 12/04/20 0311 12/05/20 0221  NA 141 140 141 140  K 3.9 4.1 4.0 4.3  CL 103 102 103 102  CO2 26 25 22 25   GLUCOSE 101* 136* 141* 116*  BUN 18 16 25* 36*  CREATININE 1.17 1.17 1.71* 1.63*  CALCIUM 8.5* 8.6* 8.3* 8.4*  MG  --   --  1.6*  --   PHOS  --   --  4.5  --    GFR: Estimated Creatinine Clearance: 25.3 mL/min (A) (by C-G formula based on SCr of 1.63 mg/dL (H)). Liver Function Tests: Recent Labs  Lab 06-Apr-2020 1700 12/04/20 0311 12/05/20 0221  AST 26 52* 86*  ALT 18 21 13   ALKPHOS 110 72 69  BILITOT 0.9 0.9 1.0  PROT 5.4* 5.1* 5.4*  ALBUMIN 3.1* 2.9* 3.0*   No results for  input(s): LIPASE, AMYLASE in the last 168 hours. No results for input(s): AMMONIA in the last 168 hours. Coagulation Profile: Recent Labs  Lab 11/18/2020 0530  INR 1.1   Cardiac Enzymes: No results for input(s): CKTOTAL, CKMB, CKMBINDEX, TROPONINI in the last 168 hours. BNP (last 3 results) No results for input(s): PROBNP in the last 8760 hours. HbA1C: Recent Labs    12/04/20 0311  HGBA1C 5.7*   CBG: Recent Labs  Lab 12/05/2020 2108  GLUCAP 136*   Lipid Profile: No results for input(s): CHOL, HDL, LDLCALC, TRIG, CHOLHDL, LDLDIRECT in the last 72 hours. Thyroid Function Tests: No results for input(s): TSH, T4TOTAL, FREET4, T3FREE, THYROIDAB in the last 72 hours. Anemia Panel: No results for input(s): VITAMINB12, FOLATE, FERRITIN, TIBC, IRON, RETICCTPCT in  the last 72 hours. Sepsis Labs: No results for input(s): PROCALCITON, LATICACIDVEN in the last 168 hours.  Recent Results (from the past 240 hour(s))  Resp Panel by RT-PCR (Flu A&B, Covid) Nasopharyngeal Swab     Status: None   Collection Time: 12/12/20  3:52 PM   Specimen: Nasopharyngeal Swab; Nasopharyngeal(NP) swabs in vial transport medium  Result Value Ref Range Status   SARS Coronavirus 2 by RT PCR NEGATIVE NEGATIVE Final    Comment: (NOTE) SARS-CoV-2 target nucleic acids are NOT DETECTED.  The SARS-CoV-2 RNA is generally detectable in upper respiratory specimens during the acute phase of infection. The lowest concentration of SARS-CoV-2 viral copies this assay can detect is 138 copies/mL. A negative result does not preclude SARS-Cov-2 infection and should not be used as the sole basis for treatment or other patient management decisions. A negative result may occur with  improper specimen collection/handling, submission of specimen other than nasopharyngeal swab, presence of viral mutation(s) within the areas targeted by this assay, and inadequate number of viral copies(<138 copies/mL). A negative result must be  combined with clinical observations, patient history, and epidemiological information. The expected result is Negative.  Fact Sheet for Patients:  BloggerCourse.com  Fact Sheet for Healthcare Providers:  SeriousBroker.it  This test is no t yet approved or cleared by the [place] FDA and  has been authorized for detection and/or diagnosis of SARS-CoV-2 by FDA under an Emergency Use Authorization (EUA). This EUA will remain  in effect (meaning this test can be used) for the duration of the COVID-19 declaration under Section 564(b)(1) of the Act, 21 U.S.C.section 360bbb-3(b)(1), unless the authorization is terminated  or revoked sooner.       Influenza A by PCR NEGATIVE NEGATIVE Final   Influenza B by PCR NEGATIVE NEGATIVE Final    Comment: (NOTE) The Xpert Xpress SARS-CoV-2/FLU/RSV plus assay is intended as an aid in the diagnosis of influenza from Nasopharyngeal swab specimens and should not be used as a sole basis for treatment. Nasal washings and aspirates are unacceptable for Xpert Xpress SARS-CoV-2/FLU/RSV testing.  Fact Sheet for Patients: BloggerCourse.com  Fact Sheet for Healthcare Providers: SeriousBroker.it  This test is not yet approved or cleared by the [place] FDA and has been authorized for detection and/or diagnosis of SARS-CoV-2 by FDA under an Emergency Use Authorization (EUA). This EUA will remain in effect (meaning this test can be used) for the duration of the COVID-19 declaration under Section 564(b)(1) of the Act, 21 U.S.C. section 360bbb-3(b)(1), unless the authorization is terminated or revoked.  Performed at [facility], [address] [place], [place] [address]          Radiology Studies: DG C-Arm 1-60 Min  Result Date: 12/10/2020 CLINICAL DATA:  Likely surgery, intramedullary nail of the left femur. EXAM: DG HIP (WITH OR  WITHOUT PELVIS) 2-3V LEFT; DG C-ARM 1-60 MIN COMPARISON:  Radiographs 12/12/20. FINDINGS: Fluoro time: 1 minutes and 24 seconds. Radiation 14.02 mGy. Seven C-arm fluoroscopic images were obtained intraoperatively and submitted for post operative interpretation. These images demonstrate intramedullary nail fixation of a left intertrochanteric femur fracture. Please see the performing provider's procedural report for further detail. IMPRESSION: Intraoperative fluoroscopic images, as detailed above. Electronically Signed   By: Feliberto Harts MD   On: 11/18/2020 15:42   DG HIP PORT UNILAT W OR W/O PELVIS 1V LEFT  Result Date: 11/30/2020 CLINICAL DATA:  Status post left hip fixation EXAM: DG HIP (WITH OR WITHOUT PELVIS) 1V PORT  LEFT COMPARISON:  Intraoperative films from earlier in the same day. FINDINGS: Previously seen proximal left femoral fracture is again noted. Short medullary rod with fixation screws is now seen similar to that noted on prior operative films. Fracture fragments are in near anatomic alignment. IMPRESSION: Status post ORIF proximal left femoral fracture Electronically Signed   By: Alcide Clever M.D.   On: 12-05-2020 16:25   DG HIP UNILAT WITH PELVIS 2-3 VIEWS LEFT  Result Date: 05-Dec-2020 CLINICAL DATA:  Likely surgery, intramedullary nail of the left femur. EXAM: DG HIP (WITH OR WITHOUT PELVIS) 2-3V LEFT; DG C-ARM 1-60 MIN COMPARISON:  Radiographs December 02, 2020. FINDINGS: Fluoro time: 1 minutes and 24 seconds. Radiation 14.02 mGy. Seven C-arm fluoroscopic images were obtained intraoperatively and submitted for post operative interpretation. These images demonstrate intramedullary nail fixation of a left intertrochanteric femur fracture. Please see the performing provider's procedural report for further detail. IMPRESSION: Intraoperative fluoroscopic images, as detailed above. Electronically Signed   By: Feliberto Harts MD   On: 12-05-20 15:42        Scheduled  Meds: . enoxaparin (LOVENOX) injection  30 mg Subcutaneous Q24H  . polyethylene glycol  17 g Oral Daily   Continuous Infusions: . sodium chloride 75 mL/hr at 12/04/20 9244     LOS: 3 days     Alwyn Ren, MD  12/05/2020, 11:35 AM

## 2020-12-06 ENCOUNTER — Inpatient Hospital Stay (HOSPITAL_COMMUNITY): Payer: Medicare HMO

## 2020-12-06 DIAGNOSIS — S72142A Displaced intertrochanteric fracture of left femur, initial encounter for closed fracture: Secondary | ICD-10-CM | POA: Diagnosis not present

## 2020-12-06 MED ORDER — SODIUM CHLORIDE 0.9 % IV SOLN: INTRAVENOUS | Status: DC

## 2020-12-06 MED ORDER — LISINOPRIL 10 MG PO TABS
10.0000 mg | ORAL_TABLET | Freq: Every day | ORAL | Status: DC
  Administered 2020-12-07 – 2020-12-11 (×5): 10 mg via ORAL
  Filled 2020-12-06 (×6): qty 1

## 2020-12-06 MED ORDER — ONDANSETRON HCL 4 MG/2ML IJ SOLN: 4.0000 mg | Freq: Four times a day (QID) | INTRAMUSCULAR | Status: DC | PRN

## 2020-12-06 NOTE — Progress Notes (Signed)
° ° °  Subjective: Patient reports pain as mild.  Tolerating diet.  Urinating.  +Flatus.  No CP, SOB.   OOB to chair  Objective:   VITALS:   Vitals:   12/05/20 0700 12/05/20 1528 12/05/20 1933 12/06/20 0423  BP: (!) 161/89 (!) 169/71 125/80 139/72  Pulse: 86 86 88 70  Resp: 17 17 15 16   Temp: 97.8 F (36.6 C) (!) 97.5 F (36.4 C) 97.8 F (36.6 C) 97.7 F (36.5 C)  TempSrc: Oral Oral Oral Oral  SpO2: 96% 95% 95% 98%  Weight:      Height:       CBC Latest Ref Rng & Units 12/05/2020 12/04/2020 December 12, 2020  WBC 4.0 - 10.5 K/uL 12.5(H) 15.5(H) 9.0  Hemoglobin 13.0 - 17.0 g/dL 10.0(L) 11.4(L) 13.4  Hematocrit 39.0 - 52.0 % 30.3(L) 36.1(L) 41.5  Platelets 150 - 400 K/uL 134(L) 139(L) 145(L)   BMP Latest Ref Rng & Units 12/05/2020 12/04/2020 12-12-20  Glucose 70 - 99 mg/dL 12/05/2020) 194(R) 740(C)  BUN 8 - 23 mg/dL 144(Y) 18(H) 16  Creatinine 0.61 - 1.24 mg/dL 63(J) 4.97(W) 2.63(Z  Sodium 135 - 145 mmol/L 140 141 140  Potassium 3.5 - 5.1 mmol/L 4.3 4.0 4.1  Chloride 98 - 111 mmol/L 102 103 102  CO2 22 - 32 mmol/L 25 22 25   Calcium 8.9 - 10.3 mg/dL 8.58) 8.3(L) 8.6(L)   Intake/Output      12/25 0701 12/26 0700 12/26 0701 12/27 0700   P.O.     I.V. (mL/kg)     IV Piggyback     Total Intake(mL/kg)     Net          Urine Occurrence 2 x       Physical Exam: General: NAD.  Resting comfortably in chair Resp: No increased wob Cardio: regular rate and rhythm ABD soft Neurologically intact MSK Neurovascularly intact Sensation intact distally Intact pulses distally Dorsiflexion/Plantar flexion intact Incision: dressing C/D/I Neurovascular intact Compartment soft    Assessment: 3 Days Post-Op  S/P Procedure(s) (LRB): INTRAMEDULLARY (IM) NAIL INTERTROCHANTRIC (Left) by Dr. 1/27 on 12-12-20  Principal Problem:   Closed comminuted intertrochanteric fracture of left femur (HCC) Active Problems:   Fall at home, initial encounter   HTN (hypertension)    Osteoporosis      Plan: Advance diet Up with therapy D/C IV fluids Discharge to SNF Incentive Spirometry Apply ice   Weight Bearing: Weight Bearing as Tolerated (WBAT) LLE Dressings: Maintain Mepilex.   VTE prophylaxis: Lovenox, SCDs, ambulation Dispo: Skilled Nursing Facility/Rehab  When cleared by PT/OT and arrangements have been made.  Follow up: Dr. Jena Gauss in his office in 2 weeks.    12/05/20, PA-C 12/06/2020, 8:09 AM

## 2020-12-06 NOTE — Progress Notes (Signed)
Repeatedly strips everything off from dressings applied to clothes

## 2020-12-06 NOTE — Progress Notes (Signed)
PROGRESS NOTE    Tom Robertson  BJY:[id number] DOB: 02-20-1926 DOA: 11/20/2020 PCP: Joycelyn Man, NP    Brief Narrative:HPI per Dr. Beola Cord on 11/17/2020 Tom Robertson a 86 y.o.malewith medical history significant ofhypertensionwho presents with hip pain following a fall from standing height. Patient was reaching to pick up a newspaper from the ground and fell on his left side. He reports severe left hip pain which is worse with any movement. Pain is 10 out of 10 at its worst but is improved to moderate pain now.He is been unable to walk since the fall. He does walk with a cane at baseline. He denies any head trauma or taking any anticoagulation. He has 2 sons who the EDP spoke with who confirmed that he is on minimal medications only antihypertensive, aspirin and vitamins. He denies fever, cough, chest pain, shortness of breath, abdominal pain, constipation, diarrhea, nausea.  ED Course:Vitals in ED significant for intermittent hypertension in the 140s to 190s systolic. Some readings of low heart rate in the upper 50s. Lab work-up showed BMP that was normal, LFTs with calcium of 8.6which corrects considering albumin of 3.1, protein of 5.4. CBC within normal notes. Respiratory panel for flu and Covid pending. Patient was given pain medication and and orthopedics was consulted for his fracture. Chest x-ray was without acute disease. DG pelvis and DG left femur showed left intratrochanteric hip fracture. CT head was without acute process, but incidental hyperdense region noticed which may need follow-up MRI. CT C-spine showed multilevel spondylosis but no acute fracture. CT abdomen redemonstrated left femoral fracture otherwise showed no acute abdominal or pelvic process diverticulosis without diverticulitis noted  Assessment & Plan:   Principal Problem:   Closed comminuted intertrochanteric fracture of left femur (HCC) Active Problems:   Fall at  home, initial encounter   HTN (hypertension)   Osteoporosis    #1 status post mechanical fall with intertrochanteric fracture of the left femur status post surgery At baseline he walks with a cane PT OT eval recommending SNF. Pain control Bowel regimen Tylenol 3 times daily Weightbearing as tolerated.  #2 history of essential hypertension -blood pressure slowly starting to trend up 144/83 restart lisinopril.   #3 hyperglycemia likely stress-induced or from IV fluids he has no history of diabetes.  Hemoglobin A1c is 5.7  #4 hyperlipidemia on statin  #5 abnormal CT head with a 1.7 cm hypodense region in the left frontal white matter that could reflect vascular malformation or dysplasia.  Nonemergent MRI with and without contrast is recommended.  Will discuss with family prior to ordering MRI.   #6 goals of care patient is DNR on admission. Estimated body mass index is 20.15 kg/m as calculated from the following:   Height as of this encounter: 5\' 9"  (1.753 m).   Weight as of this encounter: 61.9 kg.  DVT prophylaxis: Lovenox Code Status: DO NOT RESUSCITATE Family Communication: Called son was not able to speak to him disposition Plan:  Status is: Inpatient  Dispo: The patient is from: ALF              Anticipated d/c is to: SNF              Anticipated d/c date is: 3 days              Patient currently is not medically stable to d/c.   Consultants:   Ortho  Procedures: Left hip surgery Antimicrobials none  Subjective:  He is resting in  bed awake and alert Objective: Vitals:   12/05/20 1528 12/05/20 1933 12/06/20 0423 12/06/20 0921  BP: (!) 169/71 125/80 139/72 (!) 144/83  Pulse: 86 88 70 64  Resp: 17 15 16 16   Temp: (!) 97.5 F (36.4 C) 97.8 F (36.6 C) 97.7 F (36.5 C) 97.7 F (36.5 C)  TempSrc: Oral Oral Oral Oral  SpO2: 95% 95% 98% 93%  Weight:      Height:       No intake or output data in the 24 hours ending 12/06/20 1211 Filed Weights    12/04/20 1600  Weight: 61.9 kg    Examination:  General exam: Appears calm and comfortable  Respiratory system: Clear to auscultation. Respiratory effort normal. Cardiovascular system: S1 & S2 heard, RRR. No JVD, murmurs, rubs, gallops or clicks. No pedal edema. Gastrointestinal system: Abdomen is nondistended, soft and nontender. No organomegaly or masses felt. Normal bowel sounds heard. Central nervous system: Alert and oriented. No focal neurological deficits. Extremities: Symmetric 5 x 5 power.  Left hip incision covered by dressing. Skin: No rashes, lesions or ulcers Psychiatry: Judgement and insight appear normal. Mood & affect appropriate.     Data Reviewed: I have personally reviewed following labs and imaging studies  CBC: Recent Labs  Lab 11/21/2020 1700 11/14/2020 0530 12/04/20 0311 12/05/20 0221  WBC 5.2 9.0 15.5* 12.5*  NEUTROABS 4.1  --  13.8* 10.7*  HGB 14.2 13.4 11.4* 10.0*  HCT 43.1 41.5 36.1* 30.3*  MCV 98.4 98.3 100.6* 97.4  PLT 154 145* 139* 134*   Basic Metabolic Panel: Recent Labs  Lab 12/01/2020 1700 11/22/2020 0530 12/04/20 0311 12/05/20 0221  NA 141 140 141 140  K 3.9 4.1 4.0 4.3  CL 103 102 103 102  CO2 26 25 22 25   GLUCOSE 101* 136* 141* 116*  BUN 18 16 25* 36*  CREATININE 1.17 1.17 1.71* 1.63*  CALCIUM 8.5* 8.6* 8.3* 8.4*  MG  --   --  1.6*  --   PHOS  --   --  4.5  --    GFR: Estimated Creatinine Clearance: 25.3 mL/min (A) (by C-G formula based on SCr of 1.63 mg/dL (H)). Liver Function Tests: Recent Labs  Lab 11/16/2020 1700 12/04/20 0311 12/05/20 0221  AST 26 52* 86*  ALT 18 21 13   ALKPHOS 110 72 69  BILITOT 0.9 0.9 1.0  PROT 5.4* 5.1* 5.4*  ALBUMIN 3.1* 2.9* 3.0*   No results for input(s): LIPASE, AMYLASE in the last 168 hours. No results for input(s): AMMONIA in the last 168 hours. Coagulation Profile: Recent Labs  Lab 11/14/2020 0530  INR 1.1   Cardiac Enzymes: No results for input(s): CKTOTAL, CKMB, CKMBINDEX, TROPONINI  in the last 168 hours. BNP (last 3 results) No results for input(s): PROBNP in the last 8760 hours. HbA1C: Recent Labs    12/04/20 0311  HGBA1C 5.7*   CBG: Recent Labs  Lab 11/28/2020 2108  GLUCAP 136*   Lipid Profile: No results for input(s): CHOL, HDL, LDLCALC, TRIG, CHOLHDL, LDLDIRECT in the last 72 hours. Thyroid Function Tests: No results for input(s): TSH, T4TOTAL, FREET4, T3FREE, THYROIDAB in the last 72 hours. Anemia Panel: No results for input(s): VITAMINB12, FOLATE, FERRITIN, TIBC, IRON, RETICCTPCT in the last 72 hours. Sepsis Labs: No results for input(s): PROCALCITON, LATICACIDVEN in the last 168 hours.  Recent Results (from the past 240 hour(s))  Resp Panel by RT-PCR (Flu A&B, Covid) Nasopharyngeal Swab     Status: None   Collection Time: 11/17/2020  3:52 PM   Specimen: Nasopharyngeal Swab; Nasopharyngeal(NP) swabs in vial transport medium  Result Value Ref Range Status   SARS Coronavirus 2 by RT PCR NEGATIVE NEGATIVE Final    Comment: (NOTE) SARS-CoV-2 target nucleic acids are NOT DETECTED.  The SARS-CoV-2 RNA is generally detectable in upper respiratory specimens during the acute phase of infection. The lowest concentration of SARS-CoV-2 viral copies this assay can detect is 138 copies/mL. A negative result does not preclude SARS-Cov-2 infection and should not be used as the sole basis for treatment or other patient management decisions. A negative result may occur with  improper specimen collection/handling, submission of specimen other than nasopharyngeal swab, presence of viral mutation(s) within the areas targeted by this assay, and inadequate number of viral copies(<138 copies/mL). A negative result must be combined with clinical observations, patient history, and epidemiological information. The expected result is Negative.  Fact Sheet for Patients:  BloggerCourse.com  Fact Sheet for Healthcare Providers:   SeriousBroker.it  This test is no t yet approved or cleared by the [place] FDA and  has been authorized for detection and/or diagnosis of SARS-CoV-2 by FDA under an Emergency Use Authorization (EUA). This EUA will remain  in effect (meaning this test can be used) for the duration of the COVID-19 declaration under Section 564(b)(1) of the Act, 21 U.S.C.section 360bbb-3(b)(1), unless the authorization is terminated  or revoked sooner.       Influenza A by PCR NEGATIVE NEGATIVE Final   Influenza B by PCR NEGATIVE NEGATIVE Final    Comment: (NOTE) The Xpert Xpress SARS-CoV-2/FLU/RSV plus assay is intended as an aid in the diagnosis of influenza from Nasopharyngeal swab specimens and should not be used as a sole basis for treatment. Nasal washings and aspirates are unacceptable for Xpert Xpress SARS-CoV-2/FLU/RSV testing.  Fact Sheet for Patients: BloggerCourse.com  Fact Sheet for Healthcare Providers: SeriousBroker.it  This test is not yet approved or cleared by the [place] FDA and has been authorized for detection and/or diagnosis of SARS-CoV-2 by FDA under an Emergency Use Authorization (EUA). This EUA will remain in effect (meaning this test can be used) for the duration of the COVID-19 declaration under Section 564(b)(1) of the Act, 21 U.S.C. section 360bbb-3(b)(1), unless the authorization is terminated or revoked.  Performed at [facility], [address], [place], [place] [address]          Radiology Studies: No results found.      Scheduled Meds: . enoxaparin (LOVENOX) injection  30 mg Subcutaneous Q24H  . metoprolol tartrate  12.5 mg Oral BID  . polyethylene glycol  17 g Oral Daily   Continuous Infusions:    LOS: 4 days     Tom Ren, MD  12/06/2020, 12:11 PM

## 2020-12-06 NOTE — Discharge Instructions (Signed)

## 2020-12-06 NOTE — Progress Notes (Signed)
Pt noted to gag and cough up when RN tried to give him fluids. Pt spits out his med and will not suck from straw. MD notified due to concern for aspiration. Orders were placed.

## 2020-12-06 NOTE — Plan of Care (Signed)

## 2020-12-06 NOTE — Evaluation (Signed)
Clinical/Bedside Swallow Evaluation Patient Details  Name: Tom Robertson MRN: [id number] Date of Birth: February 19, 1926  Today's Date: 12/06/2020 Time: SLP Start Time (ACUTE ONLY): 1644 SLP Stop Time (ACUTE ONLY): 1659 SLP Time Calculation (min) (ACUTE ONLY): 15 min  Past Medical History:  Past Medical History:  Diagnosis Date  . HTN (hypertension)    Past Surgical History: History reviewed. No pertinent surgical history. HPI:  Pt is a 86 y.o. male with medical history significant of hypertension who presented with hip pain following a fall from standing height and inability to walk since the fall. Pt sustained intertrochanteric fracture of the left femur and is s/p surgery. Abdominal imaging: Nonobstructive bowel gas pattern. CT head negative for acute changes. Case d/w with Winfield Rast, RN who stated that the pt spat out his medication and will not suck from a straw; RN reported being concerned about the pt aspirating and therefore paged the MD for a swallow evaluation.   Assessment / Plan / Recommendation Clinical Impression  Pt was seen for bedside swallow evaluation and he denied signs of aspiration, but stated "I guess things don't always go down very smooth". Further details were requested, but pt's response was not related to the question. Oral mechanism exam was [facility] and dentition was adequate. He exhibited inconsistent throat clearing and coughing with thin and nectar thick liquids, suggesting possible aspiration. Throat clearing was also occasionally noted with puree solids. Regular texture solids were tolerated without overt s/sx of aspiration. No other significant symptoms of oropharyngeal dysphagia were noted. Pt's current diet will be continued, but a modified barium swallow study is recommended to further assess swallow function. SLP Visit Diagnosis: Dysphagia, unspecified (R13.10)    Aspiration Risk  Mild aspiration risk    Diet Recommendation Regular;Thin liquid   Liquid  Administration via: Straw;Cup Medication Administration: Whole meds with puree Supervision: Staff to assist with self feeding Compensations: Slow rate;Small sips/bites Postural Changes: Seated upright at 90 degrees    Other  Recommendations Oral Care Recommendations: Oral care BID   Follow up Recommendations  (TBD)      Frequency and Duration min 2x/week  1 week       Prognosis Prognosis for Safe Diet Advancement: Good      Swallow Study   General Date of Onset: 12/05/20 HPI: Pt is a 86 y.o. male with medical history significant of hypertension who presented with hip pain following a fall from standing height and inability to walk since the fall. Pt sustained intertrochanteric fracture of the left femur and is s/p surgery. Abdominal imaging: Nonobstructive bowel gas pattern. CT head negative for acute changes. Case d/w with Winfield Rast, RN who stated that the pt spat out his medication and will not suck from a straw; RN reported being concerned about the pt aspirating and therefore paged the MD for a swallow evaluation. Type of Study: Bedside Swallow Evaluation Previous Swallow Assessment: None Diet Prior to this Study: Regular;Thin liquids Temperature Spikes Noted: No Respiratory Status: Room air History of Recent Intubation: No (for surgery) Behavior/Cognition: Alert;Cooperative;Pleasant mood Oral Cavity Assessment: Dry Oral Care Completed by SLP: No Oral Cavity - Dentition: Adequate natural dentition Vision: Functional for self-feeding Self-Feeding Abilities: Needs assist Patient Positioning: Upright in bed;Postural control adequate for testing Baseline Vocal Quality: Normal Volitional Cough: Weak Volitional Swallow: Able to elicit    Oral/Motor/Sensory Function Overall Oral Motor/Sensory Function: Within functional limits   Ice Chips Ice chips: Within functional limits Presentation: Spoon   Thin Liquid Thin Liquid: Impaired Presentation: Cup;Straw Pharyngeal  Phase  Impairments: Throat Clearing - Immediate;Cough - Delayed;Cough - Immediate    Nectar Thick Nectar Thick Liquid: Impaired Presentation: Straw Pharyngeal Phase Impairments: Throat Clearing - Immediate;Cough - Delayed   Honey Thick Honey Thick Liquid: Not tested   Puree Puree: Impaired Presentation: Spoon Pharyngeal Phase Impairments: Throat Clearing - Delayed (inconsistent)   Solid   Tom Robertson I. Vear Clock, MS, CCC-SLP Acute Rehabilitation Services Office number [phone] Pager [phone]  Solid: Within functional limits      Tom Robertson 12/06/2020,5:07 PM

## 2020-12-07 ENCOUNTER — Encounter (HOSPITAL_COMMUNITY): Payer: Self-pay | Admitting: Student

## 2020-12-07 ENCOUNTER — Inpatient Hospital Stay (HOSPITAL_COMMUNITY): Payer: Medicare HMO

## 2020-12-07 DIAGNOSIS — S72142A Displaced intertrochanteric fracture of left femur, initial encounter for closed fracture: Secondary | ICD-10-CM | POA: Diagnosis not present

## 2020-12-07 LAB — COMPREHENSIVE METABOLIC PANEL
ALT: 14 U/L (ref 0–44)
AST: 50 U/L — ABNORMAL HIGH (ref 15–41)
Albumin: 2.6 g/dL — ABNORMAL LOW (ref 3.5–5.0)
Alkaline Phosphatase: 67 U/L (ref 38–126)
Anion gap: 11 (ref 5–15)
BUN: 27 mg/dL — ABNORMAL HIGH (ref 8–23)
CO2: 26 mmol/L (ref 22–32)
Calcium: 8.5 mg/dL — ABNORMAL LOW (ref 8.9–10.3)
Chloride: 104 mmol/L (ref 98–111)
Creatinine, Ser: 1.12 mg/dL (ref 0.61–1.24)
GFR, Estimated: >60 mL/min (ref >60)
Glucose, Bld: 102 mg/dL — ABNORMAL HIGH (ref 70–99)
Potassium: 3.6 mmol/L (ref 3.5–5.1)
Sodium: 141 mmol/L (ref 135–145)
Total Bilirubin: 1.5 mg/dL — ABNORMAL HIGH (ref 0.3–1.2)
Total Protein: 5.2 g/dL — ABNORMAL LOW (ref 6.5–8.1)

## 2020-12-07 LAB — CBC
HCT: 27.6 % — ABNORMAL LOW (ref 39.0–52.0)
Hemoglobin: 9.2 g/dL — ABNORMAL LOW (ref 13.0–17.0)
MCH: 33 pg (ref 26.0–34.0)
MCHC: 33.3 g/dL (ref 30.0–36.0)
MCV: 98.9 fL (ref 80.0–100.0)
Platelets: 162 10*3/uL (ref 150–400)
RBC: 2.79 MIL/uL — ABNORMAL LOW (ref 4.22–5.81)
RDW: 13 % (ref 11.5–15.5)
WBC: 7.9 10*3/uL (ref 4.0–10.5)
nRBC: 0 % (ref 0.0–0.2)

## 2020-12-07 MED ORDER — ENOXAPARIN SODIUM 30 MG/0.3ML ~~LOC~~ SOLN: 30.0000 mg | SUBCUTANEOUS | 0 refills | Status: AC

## 2020-12-07 MED ORDER — HYDROCODONE-ACETAMINOPHEN 5-325 MG PO TABS: 1.0000 | ORAL_TABLET | Freq: Four times a day (QID) | ORAL | 0 refills | Status: AC | PRN

## 2020-12-07 NOTE — Progress Notes (Signed)
Physical Therapy Treatment Patient Details Name: Tom Robertson MRN: [id number] DOB: 1926/04/26 Today's Date: 12/07/2020    History of Present Illness Tom Robertson DOWN is a 86 y.o. male with medical history significant of hypertension who presents with hip pain following a fall from standing height sustaining a L intertrochanteric femur fx. Patient s/p cephalomedullary L femur fx 12/23.    PT Comments    Patient making progress towards physical therapy goals. Patient oriented to self this session. Patient requires heavy assistance for bed mobility and transfers with RW. L knee buckling noted with weight shifting to take steps. Heavy posterior lean noted with fatigue. Patient continues to be limited by pain, decreased activity tolerance, impaired balance, generalized weakness. Continue to recommend SNF for ongoing Physical Therapy.       Follow Up Recommendations  SNF;Supervision/Assistance - 24 hour     Equipment Recommendations  Other (comment) (defer to post acute rehab)    Recommendations for Other Services       Precautions / Restrictions Precautions Precautions: Fall Restrictions Weight Bearing Restrictions: Yes LLE Weight Bearing: Weight bearing as tolerated    Mobility  Bed Mobility Overal bed mobility: Needs Assistance Bed Mobility: Supine to Sit;Sit to Supine     Supine to sit: HOB elevated;Mod assist Sit to supine: Max assist      Transfers Overall transfer level: Needs assistance Equipment used: Rolling Keanan Melander (2 wheeled) Transfers: Sit to/from Stand Sit to Stand: Max assist;+2 physical assistance;+2 safety/equipment (modA+2 for second trial)         General transfer comment: sit to stand x 2, initially maxA+2 then second trial modA+2. Cues for hand placemen t  Ambulation/Gait Ambulation/Gait assistance: Mod assist Gait Distance (Feet): 1 Feet (1 step fwd/1 step bwd) Assistive device: Rolling Lucette Kratz (2 wheeled) Gait Pattern/deviations: Step-to  pattern;Decreased stride length     General Gait Details: L knee buckling with weight shifting to step with modA to maintain balance. Posterior lean noted with fatigue   Stairs             Wheelchair Mobility    Modified Rankin (Stroke Patients Only)       Balance Overall balance assessment: Needs assistance;History of Falls Sitting-balance support: Single extremity supported;Feet supported Sitting balance-Leahy Scale: Poor Sitting balance - Comments: reliant on single UE support on bed rail Postural control: Posterior lean Standing balance support: Bilateral upper extremity supported;During functional activity Standing balance-Leahy Scale: Poor Standing balance comment: heavy posterior lean with fatigue                            Cognition Arousal/Alertness: Awake/alert Behavior During Therapy: WFL for tasks assessed/performed Overall Cognitive Status: Impaired/Different from baseline Area of Impairment: Orientation;Memory;Following commands;Awareness;Problem solving                 Orientation Level: Disoriented to;Place;Time;Situation   Memory: Decreased short-term memory Following Commands: Follows one step commands with increased time;Follows multi-step commands inconsistently   Awareness: Intellectual Problem Solving: Difficulty sequencing;Requires verbal cues;Requires tactile cues General Comments: oriented to self. Son present and states he has typical age memory issues      Exercises      General Comments General comments (skin integrity, edema, etc.): Son present throughout session and willing to assist      Pertinent Vitals/Pain Pain Assessment: Faces Faces Pain Scale: Hurts little more Pain Location: L hip with movement Pain Descriptors / Indicators: Grimacing;Guarding Pain Intervention(s): Monitored during session  Home Living                      Prior Function            PT Goals (current goals can now be  found in the care plan section) Acute Rehab PT Goals Patient Stated Goal: did not state PT Goal Formulation: With patient Time For Goal Achievement: 12/18/20 Potential to Achieve Goals: Fair Progress towards PT goals: Progressing toward goals    Frequency    Min 3X/week      PT Plan Current plan remains appropriate    Co-evaluation              AM-PAC PT "6 Clicks" Mobility   Outcome Measure  Help needed turning from your back to your side while in a flat bed without using bedrails?: A Lot Help needed moving from lying on your back to sitting on the side of a flat bed without using bedrails?: A Lot Help needed moving to and from a bed to a chair (including a wheelchair)?: A Lot Help needed standing up from a chair using your arms (e.g., wheelchair or bedside chair)?: A Lot Help needed to walk in hospital room?: Total Help needed climbing 3-5 steps with a railing? : Total 6 Click Score: 10    End of Session Equipment Utilized During Treatment: Gait belt Activity Tolerance: Patient tolerated treatment well Patient left: in bed;with call bell/phone within reach;with bed alarm set;with family/visitor present Nurse Communication: Mobility status PT Visit Diagnosis: Unsteadiness on feet (R26.81);Other abnormalities of gait and mobility (R26.89);Muscle weakness (generalized) (M62.81);History of falling (Z91.81)     Time: 5027-7412 PT Time Calculation (min) (ACUTE ONLY): 26 min  Charges:  $Therapeutic Activity: 23-37 mins                     Gregor Hams, PT, DPT Acute Rehabilitation Services Pager [phone] Office [phone]    [name] 12/07/2020, 3:41 PM

## 2020-12-07 NOTE — Progress Notes (Signed)
Ortho Trauma Progress Note  SUBJECTIVE: Patient reports pain as minimal. Urinating.  +Flatus.  No CP, SOB.  Had swallow study done this morning, possible diet adjustments today. Patient not hungry currently  OBJECTIVE: Vitals:   12/06/20 0921 12/06/20 1812 12/06/20 2000 12/07/20 0609  BP: (!) 144/83 (!) 142/79 (!) 148/84 128/70  Pulse: 64 76  68  Resp: 16 16  16   Temp: 97.7 F (36.5 C) 98 F (36.7 C) 98.1 F (36.7 C) (!) 97.5 F (36.4 C)  TempSrc: Oral Oral Oral Oral  SpO2: 93% 96% 96% 100%  Weight:      Height:       General: Laying in bed, NAD Resp: No increased WOB Neuro: intact LLE: Incisions C/D/I. Bruising over lateral hip and thigh as expected. No significant tenderness with palpation. Neurovascularly intact. Sensation intact distally. +DP pulse. Dorsiflexion/Plantarflexion intact. Compartments soft and compressible    ASSESSMENT: 86 year old male s/p fall, 4 Days Post-Op s/p intramedullary nailing right gfemur   PLAN: Weight Bearing: Weight Bearing as Tolerated (WBAT) LLE Incisional care: Ok to leave incisions open to air Showering: Ok to begin showering with assistance from ortho standpoint VTE prophylaxis: Lovenox, SCDs, ambulation Foley/IVFs: No foley, KVO IVFs Dispo: Therapies as tolerated, PT/OT recommending SNF. Ok for d/c from ortho standpoint once cleared by medicine team and therapies. D/C rx for pain medication and DVT prophylaxis signed and placed in chart Follow up: 2 weeks for repeat x-rays and wound check   Miloh Alcocer [address] [facility] [phone] (office) [email or web address]

## 2020-12-07 NOTE — TOC Initial Note (Signed)
Transition of Care [facility]) - Initial/Assessment Note    Patient Details  Name: Tom Robertson MRN: [id number] Date of Birth: 12/12/1926  Transition of Care [facility]) CM/SW Contact:    Janae Bridgeman, RN Phone Number: 12/07/2020, 4:25 PM  Clinical Narrative:                 Case management offered Medicare choice to the patient's son, Kathlene November, who inquired about SNF choices for the patient.  I gave him Medicare.gov choice for SNF facilities in [place] and [place] areas.  The son, Kathlene November, declined patient's admission to the two accepting facilities - [address] and [place] [facility] placement.  I called [facility] at the son's request and spoke with Renee Harder, CM for admissions at the facility and they will look at the faxed clinicals to see if they can offer the patient a bed.  Clinicals were faxed to [facility] at fax # [phone].  The patient is fully vaccinated for COVID.  TOC team will continue to follow the patient for SNF admission.  Expected Discharge Plan: Skilled Nursing Facility Barriers to Discharge: Continued Medical Work up   Patient Goals and CMS Choice Patient states their goals for this hospitalization and ongoing recovery are:: Family plans to have patient admitted to SNF facility for Rehab. before returning to [facility]. CMS Medicare.gov Compare Post Acute Care list provided to:: Patient Represenative (must comment) Lavon Paganini, son) Choice offered to / list presented to : Adult Children  Expected Discharge Plan and Services Expected Discharge Plan: Skilled Nursing Facility In-house Referral: Clinical Social Work Discharge Planning Services: CM Consult Post Acute Care Choice: Skilled Nursing Facility Living arrangements for the past 2 months: Independent Living Facility ([facility])                                      Prior Living Arrangements/Services Living arrangements for the past 2 months: Independent Living  Facility ([facility]) Lives with:: Facility Resident Patient language and need for interpreter reviewed:: Yes Do you feel safe going back to the place where you live?: Yes      Need for Family Participation in Patient Care: Yes (Comment) Care giver support system in place?: Yes (comment) Current home services: DME (uses cane) Criminal Activity/Legal Involvement Pertinent to Current Situation/Hospitalization: No - Comment as needed  Activities of Daily Living      Permission Sought/Granted Permission sought to share information with : Case Manager Permission granted to share information with : Yes, Verbal Permission Granted     Permission granted to share info w AGENCY: SNF facilities in [place] and [place] areas  Permission granted to share info w Relationship: sons - Kathlene November or Cristen Murcia     Emotional Assessment Appearance:: Appears stated age Attitude/Demeanor/Rapport: Unable to Assess Affect (typically observed): Unable to Assess Orientation: : Fluctuating Orientation (Suspected and/or reported Sundowners) Alcohol / Substance Use: Not Applicable Psych Involvement: No (comment)  Admission diagnosis:  Closed displaced intertrochanteric fracture of left femur, initial encounter (HCC) [S72.142A] Fall, initial encounter [W19.XXXA] Closed comminuted intertrochanteric fracture of left femur (HCC) [S72.142A] Patient Active Problem List   Diagnosis Date Noted  . Closed comminuted intertrochanteric fracture of left femur (HCC) 11/12/2020  . Fall at home, initial encounter 11/26/2020  . HTN (hypertension) 11/21/2020  . Osteoporosis 12/09/2020   PCP:  Joycelyn Man, NP Pharmacy:  No Pharmacies Listed  Social Determinants of Health (SDOH) Interventions    Readmission Risk Interventions Readmission Risk Prevention Plan 12/07/2020  Post Dischage Appt Complete  Medication Screening Complete  Transportation Screening Complete  Some recent data might be  hidden

## 2020-12-07 NOTE — Progress Notes (Addendum)
PROGRESS NOTE    Tom Robertson  ZOX:[id number] DOB: 1926-05-10 DOA: 12/04/2020 PCP: Joycelyn Man, NP    Brief Narrative:HPI per Dr. Beola Cord on 11/15/2020 Tom Robertson Milleris a 86 y.o.malewith medical history significant ofhypertensionwho presents with hip pain following a fall from standing height. Patient was reaching to pick up a newspaper from the ground and fell on his left side. He reports severe left hip pain which is worse with any movement. Pain is 10 out of 10 at its worst but is improved to moderate pain now.He is been unable to walk since the fall. He does walk with a cane at baseline. He denies any head trauma or taking any anticoagulation. He has 2 sons who the EDP spoke with who confirmed that he is on minimal medications only antihypertensive, aspirin and vitamins. He denies fever, cough, chest pain, shortness of breath, abdominal pain, constipation, diarrhea, nausea.  ED Course:Vitals in ED significant for intermittent hypertension in the 140s to 190s systolic. Some readings of low heart rate in the upper 50s. Lab work-up showed BMP that was normal, LFTs with calcium of 8.6which corrects considering albumin of 3.1, protein of 5.4. CBC within normal notes. Respiratory panel for flu and Covid pending. Patient was given pain medication and and orthopedics was consulted for his fracture. Chest x-ray was without acute disease. DG pelvis and DG left femur showed left intratrochanteric hip fracture. CT head was without acute process, but incidental hyperdense region noticed which may need follow-up MRI. CT C-spine showed multilevel spondylosis but no acute fracture. CT abdomen redemonstrated left femoral fracture otherwise showed no acute abdominal or pelvic process diverticulosis without diverticulitis noted  Assessment & Plan:   Principal Problem:   Closed comminuted intertrochanteric fracture of left femur (HCC) Active Problems:   Fall at  home, initial encounter   HTN (hypertension)   Osteoporosis    #1 status post mechanical fall with intertrochanteric fracture of the left femur status post surgery At baseline he walks with a cane PT OT eval recommending SNF. Pain control Bowel regimen Tylenol 3 times daily Weightbearing as tolerated.  #2 history of essential hypertension -blood pressure better after restarting lisinopril.  BP 128/70.    #3 hyperglycemia likely stress-induced or from IV fluids he has no history of diabetes.  Hemoglobin A1c is 5.7  #4 hyperlipidemia on statin  #5 abnormal CT head with a 1.7 cm hypodense region in the left frontal white matter that could reflect vascular malformation or dysplasia.  Nonemergent MRI with and without contrast is recommended.  I discussed with his son Casimiro Needle they did not want me to pursue further work-up on this.  #6 goals of care patient is DNR on admission.  #7 mild aspiration risk seen by speech recommending dysphagia 2 diet  #8 AKI he appears dry will give him a bag of fluids and recheck labs in a.m.  Estimated body mass index is 20.15 kg/m as calculated from the following:   Height as of this encounter: 5\' 9"  (1.753 m).   Weight as of this encounter: 61.9 kg.  DVT prophylaxis: Lovenox Code Status: DO NOT RESUSCITATE Family Communication: Discussed with son on the phone  And discussed with [name] in the room  disposition Plan:  Status is: Inpatient  Dispo: The patient is from: ALF              Anticipated d/c is to: SNF              Anticipated d/c  date is: 1-2 2 days depending on bed availability              Patient currently is not medically stable to d/c.   Consultants:   Ortho  Procedures: Left hip surgery Antimicrobials none  Subjective:  He is resting in bed he is awake and alert seen by speech therapist who is recommending dysphagia 2 diet. Objective: Vitals:   12/06/20 0921 12/06/20 1812 12/06/20 2000 12/07/20 0609  BP: (!)  144/83 (!) 142/79 (!) 148/84 128/70  Pulse: 64 76  68  Resp: Temp: 97.7 F (36.5 C) 98 F (36.7 C) 98.1 F (36.7 C) (!) 97.5 F (36.4 C)  TempSrc: Oral Oral Oral Oral  SpO2: 93% 96% 96% 100%  Weight:      Height:       No intake or output data in the 24 hours ending 12/07/20 1539 Filed Weights   12/04/20 1600  Weight: 61.9 kg    Examination:  General exam: Appears calm and comfortable  Respiratory system: Clear to auscultation. Respiratory effort normal. Cardiovascular system: S1 & S2 heard, RRR. No JVD, murmurs, rubs, gallops or clicks. No pedal edema. Gastrointestinal system: Abdomen is nondistended, soft and nontender. No organomegaly or masses felt. Normal bowel sounds heard. Central nervous system: Alert and oriented. No focal neurological deficits. Extremities: Symmetric 5 x 5 power.  Left hip incision covered by dressing. Skin: No rashes, lesions or ulcers Psychiatry: Judgement and insight appear normal. Mood & affect appropriate.     Data Reviewed: I have personally reviewed following labs and imaging studies  CBC: Recent Labs  Lab 12-19-20 1700 12/11/2020 0530 12/04/20 0311 12/05/20 0221 12/07/20 0803  WBC 5.2 9.0 15.5* 12.5* 7.9  NEUTROABS 4.1  --  13.8* 10.7*  --   HGB 14.2 13.4 11.4* 10.0* 9.2*  HCT 43.1 41.5 36.1* 30.3* 27.6*  MCV 98.4 98.3 100.6* 97.4 98.9  PLT 154 145* 139* 134* 162   Basic Metabolic Panel: Recent Labs  Lab 12-19-20 1700 11/28/2020 0530 12/04/20 0311 12/05/20 0221 12/07/20 0803  NA 141 140 141 140 141  K 3.9 4.1 4.0 4.3 3.6  CL 103 102 103 102 104  CO2 GLUCOSE 101* 136* 141* 116* 102*  BUN 18 16 25* 36* 27*  CREATININE 1.17 1.17 1.71* 1.63* 1.12  CALCIUM 8.5* 8.6* 8.3* 8.4* 8.5*  MG  --   --  1.6*  --   --   PHOS  --   --  4.5  --   --    GFR: Estimated Creatinine Clearance: 36.8 mL/min (by C-G formula based on SCr of 1.12 mg/dL). Liver Function Tests: Recent Labs  Lab 12/19/2020 1700  12/04/20 0311 12/05/20 0221 12/07/20 0803  AST 26 52* 86* 50*  ALT ALKPHOS 110 72 69 67  BILITOT 0.9 0.9 1.0 1.5*  PROT 5.4* 5.1* 5.4* 5.2*  ALBUMIN 3.1* 2.9* 3.0* 2.6*   No results for input(s): LIPASE, AMYLASE in the last 168 hours. No results for input(s): AMMONIA in the last 168 hours. Coagulation Profile: Recent Labs  Lab 11/12/2020 0530  INR 1.1   Cardiac Enzymes: No results for input(s): CKTOTAL, CKMB, CKMBINDEX, TROPONINI in the last 168 hours. BNP (last 3 results) No results for input(s): PROBNP in the last 8760 hours. HbA1C: No results for input(s): HGBA1C in the last 72 hours. CBG: Recent Labs  Lab 11/12/2020 2108  GLUCAP 136*   Lipid  Profile: No results for input(s): CHOL, HDL, LDLCALC, TRIG, CHOLHDL, LDLDIRECT in the last 72 hours. Thyroid Function Tests: No results for input(s): TSH, T4TOTAL, FREET4, T3FREE, THYROIDAB in the last 72 hours. Anemia Panel: No results for input(s): VITAMINB12, FOLATE, FERRITIN, TIBC, IRON, RETICCTPCT in the last 72 hours. Sepsis Labs: No results for input(s): PROCALCITON, LATICACIDVEN in the last 168 hours.  Recent Results (from the past 240 hour(s))  Resp Panel by RT-PCR (Flu A&B, Covid) Nasopharyngeal Swab     Status: None   Collection Time: 11/21/2020  3:52 PM   Specimen: Nasopharyngeal Swab; Nasopharyngeal(NP) swabs in vial transport medium  Result Value Ref Range Status   SARS Coronavirus 2 by RT PCR NEGATIVE NEGATIVE Final    Comment: (NOTE) SARS-CoV-2 target nucleic acids are NOT DETECTED.  The SARS-CoV-2 RNA is generally detectable in upper respiratory specimens during the acute phase of infection. The lowest concentration of SARS-CoV-2 viral copies this assay can detect is 138 copies/mL. A negative result does not preclude SARS-Cov-2 infection and should not be used as the sole basis for treatment or other patient management decisions. A negative result may occur with  improper specimen  collection/handling, submission of specimen other than nasopharyngeal swab, presence of viral mutation(s) within the areas targeted by this assay, and inadequate number of viral copies(<138 copies/mL). A negative result must be combined with clinical observations, patient history, and epidemiological information. The expected result is Negative.  Fact Sheet for Patients:  BloggerCourse.com  Fact Sheet for Healthcare Providers:  SeriousBroker.it  This test is no t yet approved or cleared by the [place] FDA and  has been authorized for detection and/or diagnosis of SARS-CoV-2 by FDA under an Emergency Use Authorization (EUA). This EUA will remain  in effect (meaning this test can be used) for the duration of the COVID-19 declaration under Section 564(b)(1) of the Act, 21 U.S.C.section 360bbb-3(b)(1), unless the authorization is terminated  or revoked sooner.       Influenza A by PCR NEGATIVE NEGATIVE Final   Influenza B by PCR NEGATIVE NEGATIVE Final    Comment: (NOTE) The Xpert Xpress SARS-CoV-2/FLU/RSV plus assay is intended as an aid in the diagnosis of influenza from Nasopharyngeal swab specimens and should not be used as a sole basis for treatment. Nasal washings and aspirates are unacceptable for Xpert Xpress SARS-CoV-2/FLU/RSV testing.  Fact Sheet for Patients: BloggerCourse.com  Fact Sheet for Healthcare Providers: SeriousBroker.it  This test is not yet approved or cleared by the [place] FDA and has been authorized for detection and/or diagnosis of SARS-CoV-2 by FDA under an Emergency Use Authorization (EUA). This EUA will remain in effect (meaning this test can be used) for the duration of the COVID-19 declaration under Section 564(b)(1) of the Act, 21 U.S.C. section 360bbb-3(b)(1), unless the authorization is terminated or revoked.  Performed at [facility], [address], [place], [place] [address]          Radiology Studies: DG Abd 1 View  Result Date: 12/06/2020 CLINICAL DATA:  Nausea and vomiting. EXAM: ABDOMEN - 1 VIEW COMPARISON:  None. FINDINGS: Bowel gas pattern is nonobstructive. Air and stool present throughout the colon. No free peritoneal air. Moderate degenerative change of the spine. Surgical clips are present over the pelvis. Partially visualized fixation hardware over the left femoral neck. IMPRESSION: Nonobstructive bowel gas pattern. Electronically Signed   By: Elberta Fortis M.D.   On: 12/06/2020 15:57   DG Swallowing Func-Speech Pathology  Result Date: 12/07/2020 Objective Swallowing  Evaluation: Type of Study: MBS-Modified Barium Swallow Study  Patient Details Name: TOWNSEND CUDWORTH MRN: [id number] Date of Birth: [birth year] Today's Date: 12/07/2020 Time: SLP Start Time (ACUTE ONLY): 0910 -SLP Stop Time (ACUTE ONLY): 0930 SLP Time Calculation (min) (ACUTE ONLY): 20 min Past Medical History: Past Medical History: Diagnosis Date . HTN (hypertension)  Past Surgical History: No past surgical history on file. HPI: Pt is a 86 y.o. male with medical history significant of hypertension who presented with hip pain following a fall from standing height and inability to walk since the fall. Pt sustained intertrochanteric fracture of the left femur and is s/p surgery. Abdominal imaging: Nonobstructive bowel gas pattern. CT head negative for acute changes. Case d/w with Winfield Rast, RN who stated that the pt spat out his medication and will not suck from a straw; RN reported being concerned about the pt aspirating and therefore paged the MD for a swallow evaluation.  Subjective: pleasant, alert Assessment / Plan / Recommendation CHL IP CLINICAL IMPRESSIONS 12/07/2020 Clinical Impression Patient presents with what is likely a chronic oropharyngeal dysphagia as it is unlikely his swallow function is impacted from deficits related to current  hospitalization. Patient exhibited weak oral and pharyngeal control and bolus transit with all tested consistencies, resulting in piecemeal swallowing with heavier boluses, premature spillage to vallecular sinus with nectar thick, honey thick, puree solids, and to pyriform sinus with thin liquids. With puree solids, he had moderate amount of vallecular residuals, and mild-mod pyriform residuals which would slowly clear with subsequent swallows. He had silent penetration and aspiration of trace amount before and during the swallow with thin liquids secondary to likely decreased sensory input and decreased airway protection. A cued cough and throat clear did aid in moving trace amount of penetrate out of the laryngeal vestibule, but not able to clear aspirate. Patient exhibited penetration during swallow with nectar thick liquids which led to aspiration after of trace amount. No penetration or aspiration observed with honey thick liquids, however amount of residuals in pharynx increased. Patient also appeared with some decreased UES opening and suspected decreased intraoral pressure. Chin tuck did not prevent aspiration or help to clear pharyngeal residuals. Patient will be at moderate aspiration risk for the rest of his life, and so monitoring his diet toleration and using strategies to mitigate this risk will be important. SLP Visit Diagnosis Dysphagia, oropharyngeal phase (R13.12) Attention and concentration deficit following -- Frontal lobe and executive function deficit following -- Impact on safety and function Moderate aspiration risk   CHL IP TREATMENT RECOMMENDATION 12/07/2020 Treatment Recommendations Therapy as outlined in treatment plan below   Prognosis 12/07/2020 Prognosis for Safe Diet Advancement Fair Barriers to Reach Goals Time post onset;Severity of deficits Barriers/Prognosis Comment dysphagia likely chronic CHL IP DIET RECOMMENDATION 12/07/2020 SLP Diet Recommendations Dysphagia 2 (Fine chop)  solids;Thin liquid Liquid Administration via Cup;No straw Medication Administration Crushed with puree Compensations Slow rate;Small sips/bites;Clear throat intermittently Postural Changes Seated upright at 90 degrees;Remain semi-upright after after feeds/meals (Comment)   CHL IP OTHER RECOMMENDATIONS 12/07/2020 Recommended Consults -- Oral Care Recommendations Oral care BID Other Recommendations --   CHL IP FOLLOW UP RECOMMENDATIONS 12/07/2020 Follow up Recommendations Skilled Nursing facility   [facility] IP FREQUENCY AND DURATION 12/07/2020 Speech Therapy Frequency (ACUTE ONLY) min 1 x/week Treatment Duration 1 week      CHL IP ORAL PHASE 12/07/2020 Oral Phase Impaired Oral - Pudding Teaspoon -- Oral - Pudding Cup -- Oral - Honey Teaspoon Weak lingual manipulation;Reduced posterior propulsion;Piecemeal swallowing;Lingual/palatal residue;Premature  spillage;Decreased bolus cohesion;Delayed oral transit;Lingual pumping Oral - Honey Cup -- Oral - Nectar Teaspoon Lingual pumping;Weak lingual manipulation;Reduced posterior propulsion;Lingual/palatal residue;Piecemeal swallowing;Decreased bolus cohesion Oral - Nectar Cup Reduced posterior propulsion;Weak lingual manipulation;Delayed oral transit;Premature spillage Oral - Nectar Straw -- Oral - Thin Teaspoon Delayed oral transit;Premature spillage;Reduced posterior propulsion;Weak lingual manipulation Oral - Thin Cup Weak lingual manipulation;Delayed oral transit;Decreased bolus cohesion;Reduced posterior propulsion Oral - Thin Straw -- Oral - Puree Lingual pumping;Lingual/palatal residue;Weak lingual manipulation;Delayed oral transit;Decreased bolus cohesion;Reduced posterior propulsion;Piecemeal swallowing Oral - Mech Soft -- Oral - Regular -- Oral - Multi-Consistency -- Oral - Pill -- Oral Phase - Comment --  CHL IP PHARYNGEAL PHASE 12/07/2020 Pharyngeal Phase Impaired Pharyngeal- Pudding Teaspoon -- Pharyngeal -- Pharyngeal- Pudding Cup -- Pharyngeal -- Pharyngeal- Honey  Teaspoon Delayed swallow initiation-vallecula;Reduced pharyngeal peristalsis;Pharyngeal residue - valleculae;Pharyngeal residue - pyriform;Pharyngeal residue - posterior pharnyx Pharyngeal Material does not enter airway Pharyngeal- Honey Cup -- Pharyngeal -- Pharyngeal- Nectar Teaspoon Delayed swallow initiation-vallecula;Reduced pharyngeal peristalsis;Reduced airway/laryngeal closure;Penetration/Aspiration during swallow;Penetration/Apiration after swallow;Trace aspiration;Pharyngeal residue - valleculae;Pharyngeal residue - pyriform;Pharyngeal residue - posterior pharnyx Pharyngeal Material enters airway, remains ABOVE vocal cords then ejected out Pharyngeal- Nectar Cup Delayed swallow initiation-vallecula;Reduced pharyngeal peristalsis;Reduced airway/laryngeal closure;Reduced laryngeal elevation;Penetration/Apiration after swallow;Trace aspiration;Pharyngeal residue - valleculae;Pharyngeal residue - pyriform Pharyngeal Material enters airway, passes BELOW cords without attempt by patient to eject out (silent aspiration) Pharyngeal- Nectar Straw -- Pharyngeal -- Pharyngeal- Thin Teaspoon Delayed swallow initiation-pyriform sinuses;Reduced airway/laryngeal closure;Reduced tongue base retraction;Reduced epiglottic inversion;Reduced pharyngeal peristalsis;Penetration/Aspiration during swallow;Penetration/Aspiration before swallow;Trace aspiration;Pharyngeal residue - valleculae;Pharyngeal residue - pyriform;Pharyngeal residue - posterior pharnyx Pharyngeal Material enters airway, passes BELOW cords without attempt by patient to eject out (silent aspiration) Pharyngeal- Thin Cup Delayed swallow initiation-pyriform sinuses;Reduced pharyngeal peristalsis;Reduced airway/laryngeal closure;Reduced laryngeal elevation;Reduced tongue base retraction;Penetration/Aspiration during swallow;Penetration/Aspiration before swallow;Trace aspiration;Pharyngeal residue - valleculae;Pharyngeal residue - pyriform;Pharyngeal residue -  posterior pharnyx Pharyngeal Material enters airway, passes BELOW cords without attempt by patient to eject out (silent aspiration) Pharyngeal- Thin Straw -- Pharyngeal -- Pharyngeal- Puree Delayed swallow initiation-vallecula;Reduced pharyngeal peristalsis;Pharyngeal residue - valleculae;Pharyngeal residue - pyriform;Pharyngeal residue - posterior pharnyx Pharyngeal -- Pharyngeal- Mechanical Soft -- Pharyngeal -- Pharyngeal- Regular -- Pharyngeal -- Pharyngeal- Multi-consistency -- Pharyngeal -- Pharyngeal- Pill -- Pharyngeal -- Pharyngeal Comment --  CHL IP CERVICAL ESOPHAGEAL PHASE 12/07/2020 Cervical Esophageal Phase Impaired Pudding Teaspoon -- Pudding Cup -- Honey Teaspoon Reduced cricopharyngeal relaxation Honey Cup Reduced cricopharyngeal relaxation Nectar Teaspoon Reduced cricopharyngeal relaxation Nectar Cup -- Nectar Straw -- Thin Teaspoon Reduced cricopharyngeal relaxation Thin Cup Reduced cricopharyngeal relaxation Thin Straw Reduced cricopharyngeal relaxation Puree Reduced cricopharyngeal relaxation Mechanical Soft -- Regular -- Multi-consistency -- Pill -- Cervical Esophageal Comment -- Angela NevinJohn T. Preston, MA, CCC-SLP Speech Therapy [facility]                  Scheduled Meds: . enoxaparin (LOVENOX) injection  30 mg Subcutaneous Q24H  . lisinopril  10 mg Oral Daily  . metoprolol tartrate  12.5 mg Oral BID  . polyethylene glycol  17 g Oral Daily   Continuous Infusions: . sodium chloride 75 mL/hr at 12/06/20 1809     LOS: 5 days     Alwyn RenElizabeth G Tangala Wiegert, MD  12/07/2020, 3:39 PM

## 2020-12-07 NOTE — Plan of Care (Signed)

## 2020-12-07 NOTE — Progress Notes (Signed)
Modified Barium Swallow Progress Note  Patient Details  Name: Tom Robertson MRN: [id number] Date of Birth: [birth year]  Today's Date: 12/07/2020  Modified Barium Swallow completed.  Full report located under Chart Review in the Imaging Section.  Brief recommendations include the following:  Clinical Impression  Patient presents with what is likely a chronic oropharyngeal dysphagia as it is unlikely his swallow function is impacted from deficits related to current hospitalization. Patient exhibited weak oral and pharyngeal control and bolus transit with all tested consistencies, resulting in piecemeal swallowing with heavier boluses, premature spillage to vallecular sinus with nectar thick, honey thick, puree solids, and to pyriform sinus with thin liquids. With puree solids, he had moderate amount of vallecular residuals, and mild-mod pyriform residuals which would slowly clear with subsequent swallows. He had silent penetration and aspiration of trace amount before and during the swallow with thin liquids secondary to likely decreased sensory input and decreased airway protection. A cued cough and throat clear did aid in moving trace amount of penetrate out of the laryngeal vestibule, but not able to clear aspirate. Patient exhibited penetration during swallow with nectar thick liquids which led to aspiration after of trace amount. No penetration or aspiration observed with honey thick liquids, however amount of residuals in pharynx increased. Patient also appeared with some decreased UES opening and suspected decreased intraoral pressure. Chin tuck did not prevent aspiration or help to clear pharyngeal residuals. Patient will be at moderate aspiration risk for the rest of his life, and so monitoring his diet toleration and using strategies to mitigate this risk will be important.   Swallow Evaluation Recommendations       SLP Diet Recommendations: Dysphagia 2 (Fine chop) solids;Thin liquid    Liquid Administration via: Cup;No straw   Medication Administration: Crushed with puree   Supervision: Staff to assist with self feeding;Full supervision/cueing for compensatory strategies   Compensations: Slow rate;Small sips/bites;Clear throat intermittently   Postural Changes: Seated upright at 90 degrees;Remain semi-upright after after feeds/meals (Comment)   Oral Care Recommendations: Oral care BID       Angela Nevin, MA, CCC-SLP Speech Therapy [facility]

## 2020-12-07 NOTE — Progress Notes (Signed)
Patient off unit for barium swallow test

## 2020-12-08 DIAGNOSIS — W19XXXA Unspecified fall, initial encounter: Secondary | ICD-10-CM | POA: Diagnosis not present

## 2020-12-08 DIAGNOSIS — I10 Essential (primary) hypertension: Secondary | ICD-10-CM | POA: Diagnosis not present

## 2020-12-08 DIAGNOSIS — M810 Age-related osteoporosis without current pathological fracture: Secondary | ICD-10-CM | POA: Diagnosis not present

## 2020-12-08 DIAGNOSIS — N179 Acute kidney failure, unspecified: Secondary | ICD-10-CM

## 2020-12-08 DIAGNOSIS — S72142A Displaced intertrochanteric fracture of left femur, initial encounter for closed fracture: Secondary | ICD-10-CM | POA: Diagnosis not present

## 2020-12-08 LAB — CBC WITH DIFFERENTIAL/PLATELET
Abs Immature Granulocytes: 0.03 10*3/uL (ref 0.00–0.07)
Basophils Absolute: 0 10*3/uL (ref 0.0–0.1)
Basophils Relative: 0 %
Eosinophils Absolute: 0.1 10*3/uL (ref 0.0–0.5)
Eosinophils Relative: 1 %
HCT: 33.6 % — ABNORMAL LOW (ref 39.0–52.0)
Hemoglobin: 10.7 g/dL — ABNORMAL LOW (ref 13.0–17.0)
Immature Granulocytes: 0 %
Lymphocytes Relative: 9 %
Lymphs Abs: 0.8 10*3/uL (ref 0.7–4.0)
MCH: 31.9 pg (ref 26.0–34.0)
MCHC: 31.8 g/dL (ref 30.0–36.0)
MCV: 100.3 fL — ABNORMAL HIGH (ref 80.0–100.0)
Monocytes Absolute: 0.9 10*3/uL (ref 0.1–1.0)
Monocytes Relative: 10 %
Neutro Abs: 6.8 10*3/uL (ref 1.7–7.7)
Neutrophils Relative %: 80 %
Platelets: 211 10*3/uL (ref 150–400)
RBC: 3.35 MIL/uL — ABNORMAL LOW (ref 4.22–5.81)
RDW: 13 % (ref 11.5–15.5)
WBC: 8.6 10*3/uL (ref 4.0–10.5)
nRBC: 0 % (ref 0.0–0.2)

## 2020-12-08 LAB — HEPATIC FUNCTION PANEL
ALT: 16 U/L (ref 0–44)
AST: 44 U/L — ABNORMAL HIGH (ref 15–41)
Albumin: 3.1 g/dL — ABNORMAL LOW (ref 3.5–5.0)
Alkaline Phosphatase: 81 U/L (ref 38–126)
Bilirubin, Direct: 0.4 mg/dL — ABNORMAL HIGH (ref 0.0–0.2)
Indirect Bilirubin: 1.7 mg/dL — ABNORMAL HIGH (ref 0.3–0.9)
Total Bilirubin: 2.1 mg/dL — ABNORMAL HIGH (ref 0.3–1.2)
Total Protein: 6 g/dL — ABNORMAL LOW (ref 6.5–8.1)

## 2020-12-08 LAB — MAGNESIUM: Magnesium: 2 mg/dL (ref 1.7–2.4)

## 2020-12-08 LAB — BASIC METABOLIC PANEL
Anion gap: 13 (ref 5–15)
BUN: 26 mg/dL — ABNORMAL HIGH (ref 8–23)
CO2: 27 mmol/L (ref 22–32)
Calcium: 8.6 mg/dL — ABNORMAL LOW (ref 8.9–10.3)
Chloride: 103 mmol/L (ref 98–111)
Creatinine, Ser: 1.19 mg/dL (ref 0.61–1.24)
GFR, Estimated: 57 mL/min — ABNORMAL LOW (ref >60)
Glucose, Bld: 98 mg/dL (ref 70–99)
Potassium: 3.5 mmol/L (ref 3.5–5.1)
Sodium: 143 mmol/L (ref 135–145)

## 2020-12-08 LAB — PHOSPHORUS: Phosphorus: 3.1 mg/dL (ref 2.5–4.6)

## 2020-12-08 MED ORDER — SODIUM CHLORIDE 0.9 % IV SOLN: INTRAVENOUS | Status: DC

## 2020-12-08 NOTE — Care Management Important Message (Signed)
Important Message  Patient Details  Name: Tom Robertson MRN: [id number] Date of Birth: [birth year]   Medicare Important Message Given:  Yes     [name] 12/08/2020, 4:01 PM

## 2020-12-08 NOTE — Progress Notes (Signed)
PROGRESS NOTE    Tom Robertson  FAO:[id number] DOB: [birth year] DOA: 11/30/2020 PCP: Joycelyn Man, NP   Brief Narrative:  HPI per Dr. Beola Cord on 12/01/2020 Tom Robertson a 86 y.o.malewith medical history significant ofhypertensionwho presents with hip pain following a fall from standing height. Patient was reaching to pick up a newspaper from the ground and fell on his left side. He reports severe left hip pain which is worse with any movement. Pain is 10 out of 10 at its worst but is improved to moderate pain now.He is been unable to walk since the fall. He does walk with a cane at baseline. He denies any head trauma or taking any anticoagulation. He has 2 sons who the EDP spoke with who confirmed that he is on minimal medications only antihypertensive, aspirin and vitamins. He denies fever, cough, chest pain, shortness of breath, abdominal pain, constipation, diarrhea, nausea.  ED Course:Vitals in ED significant for intermittent hypertension in the 140s to 190s systolic. Some readings of low heart rate in the upper 50s. Lab work-up showed BMP that was normal, LFTs with calcium of 8.6which corrects considering albumin of 3.1, protein of 5.4. CBC within normal notes. Respiratory panel for flu and Covid pending. Patient was given pain medication and and orthopedics was consulted for his fracture. Chest x-ray was without acute disease. DG pelvis and DG left femur showed left intratrochanteric hip fracture. CT head was without acute process, but incidental hyperdense region noticed which may need follow-up MRI. CT C-spine showed multilevel spondylosis but no acute fracture. CT abdomen redemonstrated left femoral fracture otherwise showed no acute abdominal or pelvic process diverticulosis without diverticulitis noted  **Interim History Patient was taken for surgical intervention for his intertrochanteric fracture of the left femur the setting of his  acute fall on 12/06/2020.  PT OT will need to evaluate the patient after surgical intervention to determine disposition and recommending SNF   Assessment & Plan:   Principal Problem:   Closed comminuted intertrochanteric fracture of left femur (HCC) Active Problems:   Fall at home, initial encounter   HTN (hypertension)   Osteoporosis  Acute Fall Osteoporosis Intertrochanteric fracture of the left femur in the setting of Fall status post cephalomedullary nailing on 12/04/2019 -Patient had a fall from standing height while reaching to pick a newspaper up off the ground. No preceding symptoms no loss of consciousness typically walks with a cane. -Did not hit his head, not on anticoagulation -Orthopedics consulted by EDP and will see patient in the morning; they are planning for Surgery this AM and have tentatively posted the patient for surgery -With fracture from fall from standing height meets definition for osteoporosis. -Appreciate orthopedics recommendations -Pain control initiated and c/w Hydrocodone-Acetaminophen 1 tab po q6hprn Moderate Pain, and Hydromorphone 0.5 mg IV q4hprn Severe Pain; Given IV Morphine 2 mg in the ED -C/w Miralax 17 grams po daily as well as Dailyprn -Hip fracture precautions -Takes Aspirin 81 mg po Daily at Home. Will hold for surgery  -C/w Supportive care and getting IVF with NS at 75 mL/hr; given a 500 mL bolus in the ED -AM labs repeated, check vitamin D level -N.p.o. midnight and remains NPO for anticipated surgery this AM  -Further Care per Orthopedic Surgery and Dr. Jena Gauss evaluated and recommended that the patient will require a cephalomedullary nailing of his left hip fracture.  Risks and benefits were discussed and he underwent surgical intervention on 2020/12/06 afternoon. -Orthopedic surgery recommending weightbearing as tolerated on the  left lower extremity and maintaining his Mepilex dressings and recommending Lovenox and SCDs as well as ambulation  for VT prophylaxis -PT OT recommending skilled nursing facility -At baseline he walks with a cane -Patient will need to follow-up with Dr. Jena GaussHaddix at discharge in 2 weeks  Hypertension -Intermittently hypertensive in ED; Last blood Pressure was  -Patient takes Lisinopril 20 mg po Daily at home and was held initially but resumed at 10 mg p.o. daily -Continue metoprolol tartrate 12.51 p.o. twice daily -If needed can add on IV Hydralazine  Hyperglycemia in the setting of prediabetes -Patient's Blood Sugar has been elevated on Daily BMP/CMP; on Admission was 101 and repeat was 136  -Check HbA1c and was 5.7 -Continue to Monitor Blood Sugars carefully and if Necessary will place on Sensitive Novolog SSI AC -Patient's blood sugars have been ranging from 98-141 daily on BMP/CMP  Dysphagia/Concern for aspiration -Nursing had noted that he was gagging coughing up with an RN try to give him fluids and he spit out his medications and did not suck from straw -SLP evaluated and recommended MBS -MBS done and they recommended dysphagia 2 diet with thin liquids and no straws  Thrombocytopenia -Mild as Platelet Count went from 154 -> 145 and is now improved further and is now normal at 211 -Continue to Monitor for S/Sx of Bleeding; Currently no overt bleeding noted -Repeat CBC in the AM   Hyperbilirubinemia -Patient's T bili this morning was 2.1 -Likely reactive -Patient's direct bilirubin is 0.4 and indirect was 1.7 -Continue to Monitor trend and repeat CMP in a.m.  AKI on likely CKD stage IIIa -Patient BUNs/creatinine trending up to 25/1.71 -Improved with IV fluid hydration is now trended down to 26/1.19; currently remains on IV fluid hydration with normal saline at 75 MLS per hour and will stop after 16 more hours today -Continue monitor and trend and avoid nephrotoxic medications, contrast dyes, hypotension and renally dose medications -Repeat CMP in a.m.  Macrocytic  Anemia -Postoperatively patient's hemoglobin/hematocrit remained stable at 10.7/33.6 and MCV is 100.3 -Check anemia panel in the a.m. -Continue to monitor for signs and symptoms of bleeding; currently no overt bleeding noted -Repeat CBC in a.m.  HLD -Takes Atorvastatin 20 mg po Daily and will resume  CT findings -CT head was without acute process, but incidental 1.7 cm hyperdense region noticed in the Left Frontal White matter that could reflect an area of dysplasia or vascular malformation -Follow-up nonemergent MRI with and without contrast recommended to better characterize and exclude underlying mass lesion  -Can be done after Hip procedure or in the outpatient setting  -Dr. Ashley RoyaltyMatthews spoke with the patient's son and they did not want to pursue further work-up on this  GOC: DNR, poA  DVT prophylaxis: Enoxaparin 30 mg subcu every 24 Code Status: DO NOT RESUSCITATE  Family Communication: No family present at bedside Disposition Plan: SNF when bed is available as he is medically stable to be discharged  Status is: Inpatient  Remains inpatient appropriate because:Unsafe d/c plan, IV treatments appropriate due to intensity of illness or inability to take PO and Inpatient level of care appropriate due to severity of illness   Dispo: The patient is from: Home              Anticipated d/c is to: SNF              Anticipated d/c date is: 1-2 days              Patient currently  is medically stable to d/c.   Consultants:   Orthopedic Surgery   Procedures:  CPT 27245-Cephalomedullary nailing of left intertrochanteric femur fracture done by Dr. Jena Gauss on 11/30/2020  Antimicrobials:  Anti-infectives (From admission, onward)   Start     Dose/Rate Route Frequency Ordered Stop   12/07/2020 2200  ceFAZolin (ANCEF) IVPB 2g/100 mL premix        2 g 200 mL/hr over 30 Minutes Intravenous Every 8 hours 11/15/2020 1647 12/04/20 1530   11/28/2020 1450  vancomycin (VANCOCIN) powder  Status:   Discontinued          As needed 11/19/2020 1451 11/15/2020 1543   11/17/2020 1402  ceFAZolin (ANCEF) 2-4 GM/100ML-% IVPB       Note to Pharmacy: Aquilla Hacker   : cabinet override      12/08/2020 1402 12/04/20 0214        Subjective: Seen and examined at bedside and was sitting in the chair a little frustrated.  No nausea or vomiting.  And in the lightheadedness or dizziness and states that he had some pain.  Understand that he needs to go to SNF but does not want to.  No chest pain or shortness of breath.  No other concerns or complaints at this time.  Objective: Vitals:   12/07/20 1604 12/07/20 2129 12/08/20 0555 12/08/20 0854  BP: 138/82 (!) 164/89 (!) 150/81 (!) 160/92  Pulse: 68 80 68 82  Resp:  15 16 17   Temp: 97.6 F (36.4 C) 97.8 F (36.6 C) 97.8 F (36.6 C) 97.7 F (36.5 C)  TempSrc: Oral Oral Oral Oral  SpO2: 100% 98% 99% 100%  Weight:      Height:        Intake/Output Summary (Last 24 hours) at 12/08/2020 1407 Last data filed at 12/08/2020 0327 Gross per 24 hour  Intake 657.6 ml  Output --  Net 657.6 ml   Filed Weights   12/04/20 1600  Weight: 61.9 kg   Examination: Physical Exam:  Constitutional: Thin Caucasian elderly male in no acute distress sitting in a chair appears slightly frustrated Eyes: Lids and conjunctivae normal, sclerae anicteric  ENMT: External Ears, Nose appear normal. Grossly normal hearing.  Neck: Appears normal, supple, no cervical masses, normal ROM, no appreciable thyromegaly; no JVD Respiratory: Diminished to auscultation bilaterally, no wheezing, rales, rhonchi or crackles. Normal respiratory effort and patient is not tachypenic. No accessory muscle use.  Unlabored breathing Cardiovascular: RRR, no murmurs / rubs / gallops. S1 and S2 auscultated.  No appreciable extremity edema Abdomen: Soft, non-tender, non-distended. Bowel sounds positive.  GU: Deferred. Musculoskeletal: No clubbing / cyanosis of digits/nails. No joint deformity upper  and lower extremities.  Skin: No rashes, lesions, ulcers on limited skin evaluation. No induration; Warm and dry.  Neurologic: CN 2-12 grossly intact with no focal deficits.  Romberg sign cerebellar reflexes not assessed.  Psychiatric: Normal judgment and insight. Alert and oriented x 3.  Slightly frustrated mood and appropriate affect.   Data Reviewed: I have personally reviewed following labs and imaging studies  CBC: Recent Labs  Lab 11/15/2020 1700 11/19/2020 0530 12/04/20 0311 12/05/20 0221 12/07/20 0803 12/08/20 0918  WBC 5.2 9.0 15.5* 12.5* 7.9 8.6  NEUTROABS 4.1  --  13.8* 10.7*  --  6.8  HGB 14.2 13.4 11.4* 10.0* 9.2* 10.7*  HCT 43.1 41.5 36.1* 30.3* 27.6* 33.6*  MCV 98.4 98.3 100.6* 97.4 98.9 100.3*  PLT 154 145* 139* 134* 162 211   Basic Metabolic Panel: Recent Labs  Lab 12/06/2020 0530 12/04/20 0311 12/05/20 0221 12/07/20 0803 12/08/20 0254 12/08/20 0918  NA 140 141 140 141 143  --   K 4.1 4.0 4.3 3.6 3.5  --   CL 102 103 102 104 103  --   CO2 25 22 25 26 27   --   GLUCOSE 136* 141* 116* 102* 98  --   BUN 16 25* 36* 27* 26*  --   CREATININE 1.17 1.71* 1.63* 1.12 1.19  --   CALCIUM 8.6* 8.3* 8.4* 8.5* 8.6*  --   MG  --  1.6*  --   --   --  2.0  PHOS  --  4.5  --   --   --  3.1   GFR: Estimated Creatinine Clearance: 34.7 mL/min (by C-G formula based on SCr of 1.19 mg/dL). Liver Function Tests: Recent Labs  Lab 11/14/2020 1700 12/04/20 0311 12/05/20 0221 12/07/20 0803 12/08/20 0918  AST 26 52* 86* 50* 44*  ALT 18 21 13 14 16   ALKPHOS 110 72 69 67 81  BILITOT 0.9 0.9 1.0 1.5* 2.1*  PROT 5.4* 5.1* 5.4* 5.2* 6.0*  ALBUMIN 3.1* 2.9* 3.0* 2.6* 3.1*   No results for input(s): LIPASE, AMYLASE in the last 168 hours. No results for input(s): AMMONIA in the last 168 hours. Coagulation Profile: Recent Labs  Lab 11/14/2020 0530  INR 1.1   Cardiac Enzymes: No results for input(s): CKTOTAL, CKMB, CKMBINDEX, TROPONINI in the last 168 hours. BNP (last 3  results) No results for input(s): PROBNP in the last 8760 hours. HbA1C: No results for input(s): HGBA1C in the last 72 hours. CBG: Recent Labs  Lab 12/10/2020 2108  GLUCAP 136*   Lipid Profile: No results for input(s): CHOL, HDL, LDLCALC, TRIG, CHOLHDL, LDLDIRECT in the last 72 hours. Thyroid Function Tests: No results for input(s): TSH, T4TOTAL, FREET4, T3FREE, THYROIDAB in the last 72 hours. Anemia Panel: No results for input(s): VITAMINB12, FOLATE, FERRITIN, TIBC, IRON, RETICCTPCT in the last 72 hours. Sepsis Labs: No results for input(s): PROCALCITON, LATICACIDVEN in the last 168 hours.  Recent Results (from the past 240 hour(s))  Resp Panel by RT-PCR (Flu A&B, Covid) Nasopharyngeal Swab     Status: None   Collection Time: 12/06/2020  3:52 PM   Specimen: Nasopharyngeal Swab; Nasopharyngeal(NP) swabs in vial transport medium  Result Value Ref Range Status   SARS Coronavirus 2 by RT PCR NEGATIVE NEGATIVE Final    Comment: (NOTE) SARS-CoV-2 target nucleic acids are NOT DETECTED.  The SARS-CoV-2 RNA is generally detectable in upper respiratory specimens during the acute phase of infection. The lowest concentration of SARS-CoV-2 viral copies this assay can detect is 138 copies/mL. A negative result does not preclude SARS-Cov-2 infection and should not be used as the sole basis for treatment or other patient management decisions. A negative result may occur with  improper specimen collection/handling, submission of specimen other than nasopharyngeal swab, presence of viral mutation(s) within the areas targeted by this assay, and inadequate number of viral copies(<138 copies/mL). A negative result must be combined with clinical observations, patient history, and epidemiological information. The expected result is Negative.  Fact Sheet for Patients:  2109  Fact Sheet for Healthcare Providers:  12/04/20  This  test is no t yet approved or cleared by the [employer] FDA and  has been authorized for detection and/or diagnosis of SARS-CoV-2 by FDA under an Emergency Use Authorization (EUA). This EUA will remain  in effect (meaning this test can be used) for the  duration of the COVID-19 declaration under Section 564(b)(1) of the Act, 21 U.S.C.section 360bbb-3(b)(1), unless the authorization is terminated  or revoked sooner.       Influenza A by PCR NEGATIVE NEGATIVE Final   Influenza B by PCR NEGATIVE NEGATIVE Final    Comment: (NOTE) The Xpert Xpress SARS-CoV-2/FLU/RSV plus assay is intended as an aid in the diagnosis of influenza from Nasopharyngeal swab specimens and should not be used as a sole basis for treatment. Nasal washings and aspirates are unacceptable for Xpert Xpress SARS-CoV-2/FLU/RSV testing.  Fact Sheet for Patients: [employer]  Fact Sheet for Healthcare Providers: [employer]it  This test is not yet approved or cleared by the [place] FDA and has been authorized for detection and/or diagnosis of SARS-CoV-2 by FDA under an Emergency Use Authorization (EUA). This EUA will remain in effect (meaning this test can be used) for the duration of the COVID-19 declaration under Section 564(b)(1) of the Act, 21 U.S.C. section 360bbb-3(b)(1), unless the authorization is terminated or revoked.  Performed at [facility], [address] [place], [place] [address]      RN Pressure Injury Documentation:     Estimated body mass index is 20.15 kg/m as calculated from the following:   Height as of this encounter:  (1.753 m).   Weight as of this encounter: 61.9 kg.  Malnutrition Type:   Malnutrition Characteristics:   Nutrition Interventions:     Radiology Studies: DG Abd 1 View  Result Date: 12/06/2020 CLINICAL DATA:  Nausea and vomiting. EXAM: ABDOMEN - 1 VIEW COMPARISON:  None. FINDINGS:  Bowel gas pattern is nonobstructive. Air and stool present throughout the colon. No free peritoneal air. Moderate degenerative change of the spine. Surgical clips are present over the pelvis. Partially visualized fixation hardware over the left femoral neck. IMPRESSION: Nonobstructive bowel gas pattern. Electronically Signed   By: Elberta Fortis M.D.   On: 12/06/2020 15:57   DG Swallowing Func-Speech Pathology  Result Date: 12/07/2020 Objective Swallowing Evaluation: Type of Study: MBS-Modified Barium Swallow Study  Patient Details Name: Tom Robertson MRN: [id number] Date of Birth: 1926-08-08 Today's Date: 12/07/2020 Time: SLP Start Time (ACUTE ONLY): 0910 -SLP Stop Time (ACUTE ONLY): 0930 SLP Time Calculation (min) (ACUTE ONLY): 20 min Past Medical History: Past Medical History: Diagnosis Date . HTN (hypertension)  Past Surgical History: No past surgical history on file. HPI: Pt is a 87 y.o. male with medical history significant of hypertension who presented with hip pain following a fall from standing height and inability to walk since the fall. Pt sustained intertrochanteric fracture of the left femur and is s/p surgery. Abdominal imaging: Nonobstructive bowel gas pattern. CT head negative for acute changes. Case d/w with Winfield Rast, RN who stated that the pt spat out his medication and will not suck from a straw; RN reported being concerned about the pt aspirating and therefore paged the MD for a swallow evaluation.  Subjective: pleasant, alert Assessment / Plan / Recommendation CHL IP CLINICAL IMPRESSIONS 12/07/2020 Clinical Impression Patient presents with what is likely a chronic oropharyngeal dysphagia as it is unlikely his swallow function is impacted from deficits related to current hospitalization. Patient exhibited weak oral and pharyngeal control and bolus transit with all tested consistencies, resulting in piecemeal swallowing with heavier boluses, premature spillage to vallecular sinus with nectar  thick, honey thick, puree solids, and to pyriform sinus with thin liquids. With puree solids, he had moderate amount of vallecular residuals, and mild-mod pyriform residuals which would slowly  clear with subsequent swallows. He had silent penetration and aspiration of trace amount before and during the swallow with thin liquids secondary to likely decreased sensory input and decreased airway protection. A cued cough and throat clear did aid in moving trace amount of penetrate out of the laryngeal vestibule, but not able to clear aspirate. Patient exhibited penetration during swallow with nectar thick liquids which led to aspiration after of trace amount. No penetration or aspiration observed with honey thick liquids, however amount of residuals in pharynx increased. Patient also appeared with some decreased UES opening and suspected decreased intraoral pressure. Chin tuck did not prevent aspiration or help to clear pharyngeal residuals. Patient will be at moderate aspiration risk for the rest of his life, and so monitoring his diet toleration and using strategies to mitigate this risk will be important. SLP Visit Diagnosis Dysphagia, oropharyngeal phase (R13.12) Attention and concentration deficit following -- Frontal lobe and executive function deficit following -- Impact on safety and function Moderate aspiration risk   CHL IP TREATMENT RECOMMENDATION 12/07/2020 Treatment Recommendations Therapy as outlined in treatment plan below   Prognosis 12/07/2020 Prognosis for Safe Diet Advancement Fair Barriers to Reach Goals Time post onset;Severity of deficits Barriers/Prognosis Comment dysphagia likely chronic CHL IP DIET RECOMMENDATION 12/07/2020 SLP Diet Recommendations Dysphagia 2 (Fine chop) solids;Thin liquid Liquid Administration via Cup;No straw Medication Administration Crushed with puree Compensations Slow rate;Small sips/bites;Clear throat intermittently Postural Changes Seated upright at 90 degrees;Remain  semi-upright after after feeds/meals (Comment)   CHL IP OTHER RECOMMENDATIONS 12/07/2020 Recommended Consults -- Oral Care Recommendations Oral care BID Other Recommendations --   CHL IP FOLLOW UP RECOMMENDATIONS 12/07/2020 Follow up Recommendations Skilled Nursing facility   [facility] IP FREQUENCY AND DURATION 12/07/2020 Speech Therapy Frequency (ACUTE ONLY) min 1 x/week Treatment Duration 1 week      CHL IP ORAL PHASE 12/07/2020 Oral Phase Impaired Oral - Pudding Teaspoon -- Oral - Pudding Cup -- Oral - Honey Teaspoon Weak lingual manipulation;Reduced posterior propulsion;Piecemeal swallowing;Lingual/palatal residue;Premature spillage;Decreased bolus cohesion;Delayed oral transit;Lingual pumping Oral - Honey Cup -- Oral - Nectar Teaspoon Lingual pumping;Weak lingual manipulation;Reduced posterior propulsion;Lingual/palatal residue;Piecemeal swallowing;Decreased bolus cohesion Oral - Nectar Cup Reduced posterior propulsion;Weak lingual manipulation;Delayed oral transit;Premature spillage Oral - Nectar Straw -- Oral - Thin Teaspoon Delayed oral transit;Premature spillage;Reduced posterior propulsion;Weak lingual manipulation Oral - Thin Cup Weak lingual manipulation;Delayed oral transit;Decreased bolus cohesion;Reduced posterior propulsion Oral - Thin Straw -- Oral - Puree Lingual pumping;Lingual/palatal residue;Weak lingual manipulation;Delayed oral transit;Decreased bolus cohesion;Reduced posterior propulsion;Piecemeal swallowing Oral - Mech Soft -- Oral - Regular -- Oral - Multi-Consistency -- Oral - Pill -- Oral Phase - Comment --  CHL IP PHARYNGEAL PHASE 12/07/2020 Pharyngeal Phase Impaired Pharyngeal- Pudding Teaspoon -- Pharyngeal -- Pharyngeal- Pudding Cup -- Pharyngeal -- Pharyngeal- Honey Teaspoon Delayed swallow initiation-vallecula;Reduced pharyngeal peristalsis;Pharyngeal residue - valleculae;Pharyngeal residue - pyriform;Pharyngeal residue - posterior pharnyx Pharyngeal Material does not enter airway  Pharyngeal- Honey Cup -- Pharyngeal -- Pharyngeal- Nectar Teaspoon Delayed swallow initiation-vallecula;Reduced pharyngeal peristalsis;Reduced airway/laryngeal closure;Penetration/Aspiration during swallow;Penetration/Apiration after swallow;Trace aspiration;Pharyngeal residue - valleculae;Pharyngeal residue - pyriform;Pharyngeal residue - posterior pharnyx Pharyngeal Material enters airway, remains ABOVE vocal cords then ejected out Pharyngeal- Nectar Cup Delayed swallow initiation-vallecula;Reduced pharyngeal peristalsis;Reduced airway/laryngeal closure;Reduced laryngeal elevation;Penetration/Apiration after swallow;Trace aspiration;Pharyngeal residue - valleculae;Pharyngeal residue - pyriform Pharyngeal Material enters airway, passes BELOW cords without attempt by patient to eject out (silent aspiration) Pharyngeal- Nectar Straw -- Pharyngeal -- Pharyngeal- Thin Teaspoon Delayed swallow initiation-pyriform sinuses;Reduced airway/laryngeal closure;Reduced tongue base retraction;Reduced epiglottic inversion;Reduced pharyngeal peristalsis;Penetration/Aspiration during swallow;Penetration/Aspiration  before swallow;Trace aspiration;Pharyngeal residue - valleculae;Pharyngeal residue - pyriform;Pharyngeal residue - posterior pharnyx Pharyngeal Material enters airway, passes BELOW cords without attempt by patient to eject out (silent aspiration) Pharyngeal- Thin Cup Delayed swallow initiation-pyriform sinuses;Reduced pharyngeal peristalsis;Reduced airway/laryngeal closure;Reduced laryngeal elevation;Reduced tongue base retraction;Penetration/Aspiration during swallow;Penetration/Aspiration before swallow;Trace aspiration;Pharyngeal residue - valleculae;Pharyngeal residue - pyriform;Pharyngeal residue - posterior pharnyx Pharyngeal Material enters airway, passes BELOW cords without attempt by patient to eject out (silent aspiration) Pharyngeal- Thin Straw -- Pharyngeal -- Pharyngeal- Puree Delayed swallow  initiation-vallecula;Reduced pharyngeal peristalsis;Pharyngeal residue - valleculae;Pharyngeal residue - pyriform;Pharyngeal residue - posterior pharnyx Pharyngeal -- Pharyngeal- Mechanical Soft -- Pharyngeal -- Pharyngeal- Regular -- Pharyngeal -- Pharyngeal- Multi-consistency -- Pharyngeal -- Pharyngeal- Pill -- Pharyngeal -- Pharyngeal Comment --  CHL IP CERVICAL ESOPHAGEAL PHASE 12/07/2020 Cervical Esophageal Phase Impaired Pudding Teaspoon -- Pudding Cup -- Honey Teaspoon Reduced cricopharyngeal relaxation Honey Cup Reduced cricopharyngeal relaxation Nectar Teaspoon Reduced cricopharyngeal relaxation Nectar Cup -- Nectar Straw -- Thin Teaspoon Reduced cricopharyngeal relaxation Thin Cup Reduced cricopharyngeal relaxation Thin Straw Reduced cricopharyngeal relaxation Puree Reduced cricopharyngeal relaxation Mechanical Soft -- Regular -- Multi-consistency -- Pill -- Cervical Esophageal Comment -- Angela Nevin, MA, CCC-SLP Speech Therapy [facility]             Scheduled Meds: . enoxaparin (LOVENOX) injection  30 mg Subcutaneous Q24H  . lisinopril  10 mg Oral Daily  . metoprolol tartrate  12.5 mg Oral BID  . polyethylene glycol  17 g Oral Daily   Continuous Infusions: . sodium chloride Stopped (12/06/20 2240)    LOS: 6 days   Merlene Laughter, DO [facility] PAGER is on AMION  If 7PM-7AM, please contact night-coverage [email or web address]

## 2020-12-08 NOTE — Plan of Care (Signed)
  Problem: Activity: Goal: Risk for activity intolerance will decrease Outcome: Progressing   Problem: Pain Managment: Goal: General experience of comfort will improve Outcome: Progressing   Problem: Safety: Goal: Ability to remain free from injury will improve Outcome: Not Progressing

## 2020-12-08 NOTE — TOC Progression Note (Signed)
Transition of Care [facility]) - Progression Note    Patient Details  Name: Tom Robertson MRN: [id number] Date of Birth: [birth year]  Transition [facility]) CM/SW Contact  Janae Bridgeman, RN Phone Number: 12/08/2020, 4:56 PM  Clinical Narrative:    [facility] and left two messages with admission to inquire about possible placement at the SNF facility.   Expected Discharge Plan: Skilled Nursing Facility Barriers to Discharge: Continued Medical Work up  Expected Discharge Plan and Services Expected Discharge Plan: Skilled Nursing Facility In-house Referral: Clinical Social Work Discharge Planning Services: CM Consult Post Acute Care Choice: Skilled Nursing Facility Living arrangements for the past 2 months: Independent Living Facility ([facility])                                       Social Determinants of Health (SDOH) Interventions    Readmission Risk Interventions Readmission Risk Prevention Plan 12/07/2020  Post Dischage Appt Complete  Medication Screening Complete  Transportation Screening Complete  Some recent data might be hidden

## 2020-12-09 DIAGNOSIS — R410 Disorientation, unspecified: Secondary | ICD-10-CM

## 2020-12-09 DIAGNOSIS — M810 Age-related osteoporosis without current pathological fracture: Secondary | ICD-10-CM | POA: Diagnosis not present

## 2020-12-09 DIAGNOSIS — S72142A Displaced intertrochanteric fracture of left femur, initial encounter for closed fracture: Secondary | ICD-10-CM | POA: Diagnosis not present

## 2020-12-09 DIAGNOSIS — W19XXXA Unspecified fall, initial encounter: Secondary | ICD-10-CM | POA: Diagnosis not present

## 2020-12-09 DIAGNOSIS — I10 Essential (primary) hypertension: Secondary | ICD-10-CM | POA: Diagnosis not present

## 2020-12-09 LAB — COMPREHENSIVE METABOLIC PANEL
ALT: 14 U/L (ref 0–44)
AST: 31 U/L (ref 15–41)
Albumin: 2.7 g/dL — ABNORMAL LOW (ref 3.5–5.0)
Alkaline Phosphatase: 71 U/L (ref 38–126)
Anion gap: 9 (ref 5–15)
BUN: 23 mg/dL (ref 8–23)
CO2: 27 mmol/L (ref 22–32)
Calcium: 8 mg/dL — ABNORMAL LOW (ref 8.9–10.3)
Chloride: 105 mmol/L (ref 98–111)
Creatinine, Ser: 1.06 mg/dL (ref 0.61–1.24)
GFR, Estimated: >60 mL/min (ref >60)
Glucose, Bld: 99 mg/dL (ref 70–99)
Potassium: 3.6 mmol/L (ref 3.5–5.1)
Sodium: 141 mmol/L (ref 135–145)
Total Bilirubin: 1.5 mg/dL — ABNORMAL HIGH (ref 0.3–1.2)
Total Protein: 5.2 g/dL — ABNORMAL LOW (ref 6.5–8.1)

## 2020-12-09 LAB — CBC WITH DIFFERENTIAL/PLATELET
Abs Immature Granulocytes: 0.03 10*3/uL (ref 0.00–0.07)
Basophils Absolute: 0 10*3/uL (ref 0.0–0.1)
Basophils Relative: 0 %
Eosinophils Absolute: 0.2 10*3/uL (ref 0.0–0.5)
Eosinophils Relative: 3 %
HCT: 31.1 % — ABNORMAL LOW (ref 39.0–52.0)
Hemoglobin: 9.9 g/dL — ABNORMAL LOW (ref 13.0–17.0)
Immature Granulocytes: 1 %
Lymphocytes Relative: 10 %
Lymphs Abs: 0.6 10*3/uL — ABNORMAL LOW (ref 0.7–4.0)
MCH: 31.9 pg (ref 26.0–34.0)
MCHC: 31.8 g/dL (ref 30.0–36.0)
MCV: 100.3 fL — ABNORMAL HIGH (ref 80.0–100.0)
Monocytes Absolute: 0.6 10*3/uL (ref 0.1–1.0)
Monocytes Relative: 10 %
Neutro Abs: 4.8 10*3/uL (ref 1.7–7.7)
Neutrophils Relative %: 76 %
Platelets: 183 10*3/uL (ref 150–400)
RBC: 3.1 MIL/uL — ABNORMAL LOW (ref 4.22–5.81)
RDW: 13.2 % (ref 11.5–15.5)
WBC: 6.2 10*3/uL (ref 4.0–10.5)
nRBC: 0 % (ref 0.0–0.2)

## 2020-12-09 LAB — RETICULOCYTES
Immature Retic Fract: 31.8 % — ABNORMAL HIGH (ref 2.3–15.9)
RBC.: 3.01 MIL/uL — ABNORMAL LOW (ref 4.22–5.81)
Retic Count, Absolute: 93 10*3/uL (ref 19.0–186.0)
Retic Ct Pct: 3.1 % (ref 0.4–3.1)

## 2020-12-09 LAB — IRON AND TIBC
Iron: 45 ug/dL (ref 45–182)
Saturation Ratios: 19 % (ref 17.9–39.5)
TIBC: 235 ug/dL — ABNORMAL LOW (ref 250–450)
UIBC: 190 ug/dL

## 2020-12-09 LAB — FERRITIN: Ferritin: 117 ng/mL (ref 24–336)

## 2020-12-09 LAB — VITAMIN B12: Vitamin B-12: 157 pg/mL — ABNORMAL LOW (ref 180–914)

## 2020-12-09 LAB — MAGNESIUM: Magnesium: 1.9 mg/dL (ref 1.7–2.4)

## 2020-12-09 LAB — PHOSPHORUS: Phosphorus: 3.4 mg/dL (ref 2.5–4.6)

## 2020-12-09 LAB — FOLATE: Folate: 29.2 ng/mL (ref >5.9)

## 2020-12-09 MED ORDER — DEXTROSE-NACL 5-0.45 % IV SOLN: INTRAVENOUS | Status: DC

## 2020-12-09 MED ORDER — LORAZEPAM 2 MG/ML IJ SOLN
INTRAMUSCULAR | Status: AC
  Administered 2020-12-09: 06:00:00 2 mg
  Filled 2020-12-09: qty 1

## 2020-12-09 MED ORDER — OLANZAPINE 5 MG PO TBDP
5.0000 mg | ORAL_TABLET | Freq: Every day | ORAL | Status: DC
  Administered 2020-12-09 – 2020-12-11 (×3): 5 mg via ORAL
  Filled 2020-12-09 (×3): qty 1

## 2020-12-09 MED ORDER — LORAZEPAM 2 MG/ML IJ SOLN: 1.0000 mg | Freq: Once | INTRAMUSCULAR | Status: AC

## 2020-12-09 NOTE — Plan of Care (Signed)

## 2020-12-09 NOTE — Progress Notes (Signed)
OT Cancellation Note  Patient Details Name: Tom Robertson MRN: [id number] DOB: 01-26-1926   Cancelled Treatment:    Reason Eval/Treat Not Completed: Fatigue/lethargy limiting ability to participate. Held off on therapy earlier this AM per staff request to allow pt to sleep. Pt received Dilaudid earlier this AM. On therapy attempts this afternoon, niece reports pt has only been asleep 2 hours. Pt sleeping soundly and unable to be aroused. Will re-attempt session tomorrow.   Lorre Munroe 12/09/2020, 1:47 PM

## 2020-12-09 NOTE — Progress Notes (Signed)
Patient became confused and combative attempting to get out of bed having loud out burst [facility] was page and new orders 1mg  Ativan IM given with no effect.Security was called to help calm patient down. Telephone call to and he spoke with his father but patient was still agitated.

## 2020-12-09 NOTE — Progress Notes (Signed)
PROGRESS NOTE    CHRISTIN MCCREEDY  MWN:[id number] DOB: [birth year] DOA: 11/12/2020 PCP: Joycelyn Man, NP   Brief Narrative:  HPI per Dr. Beola Cord on 12/09/2020 [name] a 86 y.o.malewith medical history significant ofhypertensionwho presents with hip pain following a fall from standing height. Patient was reaching to pick up a newspaper from the ground and fell on his left side. He reports severe left hip pain which is worse with any movement. Pain is 10 out of 10 at its worst but is improved to moderate pain now.He is been unable to walk since the fall. He does walk with a cane at baseline. He denies any head trauma or taking any anticoagulation. He has 2 sons who the EDP spoke with who confirmed that he is on minimal medications only antihypertensive, aspirin and vitamins. He denies fever, cough, chest pain, shortness of breath, abdominal pain, constipation, diarrhea, nausea.  ED Course:Vitals in ED significant for intermittent hypertension in the 140s to 190s systolic. Some readings of low heart rate in the upper 50s. Lab work-up showed BMP that was normal, LFTs with calcium of 8.6which corrects considering albumin of 3.1, protein of 5.4. CBC within normal notes. Respiratory panel for flu and Covid pending. Patient was given pain medication and and orthopedics was consulted for his fracture. Chest x-ray was without acute disease. DG pelvis and DG left femur showed left intratrochanteric hip fracture. CT head was without acute process, but incidental hyperdense region noticed which may need follow-up MRI. CT C-spine showed multilevel spondylosis but no acute fracture. CT abdomen redemonstrated left femoral fracture otherwise showed no acute abdominal or pelvic process diverticulosis without diverticulitis noted  **Interim History Patient was taken for surgical intervention for his intertrochanteric fracture of the left femur the setting of his  acute fall on December 27, 2020.  PT OT will need to evaluate the patient after surgical intervention to determine disposition and recommending SNF.  He was deemed medically stable to be discharged yesterday but overnight became extremely confused and delirious.  Is very agitated this morning and given Zyprexa.  We have placed him on delirium precautions if he continues to be confused will work-up further with imaging currently feels that this is related to acute delirium.  Assessment & Plan:   Principal Problem:   Closed comminuted intertrochanteric fracture of left femur (HCC) Active Problems:   Fall at home, initial encounter   HTN (hypertension)   Osteoporosis  Acute Fall Osteoporosis Intertrochanteric fracture of the left femur in the setting of Fall status post cephalomedullary nailing on 12/04/2019 -Patient had a fall from standing height while reaching to pick a newspaper up off the ground. No preceding symptoms no loss of consciousness typically walks with a cane. -Did not hit his head, not on anticoagulation -Orthopedic surgery evaluated and patient was taken to surgery on 27-Dec-2020 -With fracture from fall from standing height meets definition for osteoporosis. -Appreciate orthopedics recommendations -Pain control initiated and c/w Hydrocodone-Acetaminophen 1 tab po q6hprn Moderate Pain, and Hydromorphone 0.5 mg IV q4hprn Severe Pain; Given IV Morphine 2 mg in the ED -C/w Miralax 17 grams po daily as well as Dailyprn -Hip fracture precautions -Takes Aspirin 81 mg po Daily at Home. Will hold for surgery  -C/w Supportive care and getting IVF with NS at 75 mL/hr; given a 500 mL bolus in the ED -AM labs repeated, check vitamin D level -Further Care per Orthopedic Surgery and Dr. Jena Gauss evaluated and recommended that the patient will require a cephalomedullary nailing  of his left hip fracture.  Risks and benefits were discussed and he underwent surgical intervention on 12-04-20  afternoon. -Orthopedic surgery recommending weightbearing as tolerated on the left lower extremity and maintaining his Mepilex dressings and recommending Lovenox and SCDs as well as ambulation for VT prophylaxis as well as enoxaparin 30 mg subcu every 12 hours -PT OT recommending skilled nursing facility -At baseline he walks with a cane -Patient will need to follow-up with Dr. Jena Gauss at discharge in 2 weeks  Acute Delirium with significant confusion and agitation -Overnight to get out of beds and started having loud outbursts.  He was given 1 mg of IM Ativan and security was called to help calm him down  -Bladder scan to assess if the patient is retaining urine -Labs are within normal limits and he is not hyponatremic hypokalemia and renal function is improved  -Given p.o. Zyprexa 5 mg sublingually -continue to reorient frequently and continue with delirium precautions -Have family sitter at bedside -Continue with bowel regimen and pain control -I have removed his restraints and patient was not actually ever restrained overnight -If he is not improving may work-up further with head imaging and an EEG  Hypertension -Intermittently hypertensive in ED; Last blood Pressure was  -Patient takes Lisinopril 20 mg po Daily at home and was held initially but resumed at 10 mg p.o. daily -Continue metoprolol tartrate 12.5 milligrams p.o. twice daily -If needed can add on IV Hydralazine -Continue to monitor blood pressures per protocol and last blood pressure was 150/98  Hyperglycemia in the setting of prediabetes -Patient's Blood Sugar has been elevated on Daily BMP/CMP; on Admission was 101 and repeat was 136  -Check HbA1c and was 5.7 -Continue to Monitor Blood Sugars carefully and if Necessary will place on Sensitive Novolog SSI AC -Patient's blood sugars have been ranging from 98-141 daily on BMP/CMP  Dysphagia/Concern for aspiration -Nursing had noted that he was gagging coughing up with an  RN try to give him fluids and he spit out his medications and did not suck from straw -SLP evaluated and recommended MBS -MBS done and they recommended dysphagia 2 diet with thin liquids and no straws  Thrombocytopenia -Mild as Platelet Count went from 154 -> 145 and is now improved further and is now normal at 183 -Continue to Monitor for S/Sx of Bleeding; Currently no overt bleeding noted -Repeat CBC in the AM   Hyperbilirubinemia -Patient's T bili was 2.1 is now improved to 1.5 -Likely reactive -Patient's direct bilirubin is 0.4 and indirect was 1.7 on 12/08/2020 -Continue to Monitor trend and repeat CMP in a.m.  AKI on likely CKD stage IIIa -Patient BUNs/creatinine trended up to 25/1.71 -Improved with IV fluid hydration is now trended down to 23/1.06; currently remains on IV fluid hydration with normal saline at 75 MLS per hour and IV fluids have now stopped -Continue monitor and trend and avoid nephrotoxic medications, contrast dyes, hypotension and renally dose medications -Repeat CMP in a.m.  Macrocytic Anemia -Postoperatively patient's hemoglobin/hematocrit remained stable at 9.9/31.1 and MCV is 100.3 -Check anemia panel and showed an iron level of 45, U IBC of 190, TIBC of 235, saturation ratios of 19%, ferritin level 117, folate level 29.2, and vitamin B12 level is 157 -Continue to monitor for signs and symptoms of bleeding; currently no overt bleeding noted -Repeat CBC in a.m.  HLD -Takes Atorvastatin 20 mg po Daily and will resume  CT findings -CT head was without acute process, but incidental 1.7 cm hyperdense region  noticed in the Left Frontal White matter that could reflect an area of dysplasia or vascular malformation -Follow-up nonemergent MRI with and without contrast recommended to better characterize and exclude underlying mass lesion  -Can be done after Hip procedure or in the outpatient setting  -Dr. Ashley Royalty spoke with the patient's son and they did not want  to pursue further work-up on this  GOC: DNR, poA  DVT prophylaxis: Enoxaparin 30 mg subcu every 24 Code Status: DO NOT RESUSCITATE  Family Communication: No family present at bedside Disposition Plan: SNF when bed is available as he is medically stable to be discharged; he was deemed medically stable yesterday however overnight he became extremely agitated and combative and likely has acute delirium.  Will monitor overnight to see if his delirium resolves  Status is: Inpatient  Remains inpatient appropriate because:Unsafe d/c plan, IV treatments appropriate due to intensity of illness or inability to take PO and Inpatient level of care appropriate due to severity of illness   Dispo: The patient is from: Home              Anticipated d/c is to: SNF              Anticipated d/c date is: 1-2 days              Patient currently is medically stable to d/c.   Consultants:   Orthopedic Surgery   Procedures:  CPT 27245-Cephalomedullary nailing of left intertrochanteric femur fracture done by Dr. Jena Gauss on 12/01/2020  Antimicrobials:  Anti-infectives (From admission, onward)   Start     Dose/Rate Route Frequency Ordered Stop   12/10/2020 2200  ceFAZolin (ANCEF) IVPB 2g/100 mL premix        2 g 200 mL/hr over 30 Minutes Intravenous Every 8 hours 11/24/2020 1647 12/04/20 1530   11/19/2020 1450  vancomycin (VANCOCIN) powder  Status:  Discontinued          As needed 11/17/2020 1451 12/09/2020 1543   12/05/2020 1402  ceFAZolin (ANCEF) 2-4 GM/100ML-% IVPB       Note to Pharmacy: Aquilla Hacker   : cabinet override      11/27/2020 1402 12/04/20 0214        Subjective: Seen and examined at bedside and he is extremely agitated and reaching for things that were not there.  No nausea or vomiting.  Family states that he did not sleep at all last night.  Was extremely agitated and had to be given IM Ativan which did not really help.  He is unable to provide a subjective history currently but does answer and  mumble and is incomprehensible about what he says.  Objective: Vitals:   12/08/20 1500 12/08/20 1900 12/09/20 0430 12/09/20 0735  BP: (!) 161/88 (!) 165/84 (!) 165/86 (!) 150/98  Pulse: 88 78 67 76  Resp: Temp: 98.6 F (37 C) 98.3 F (36.8 C) 97.6 F (36.4 C) 97.7 F (36.5 C)  TempSrc: Oral Oral Oral Axillary  SpO2: 100% 100% 99% 95%  Weight:      Height:        Intake/Output Summary (Last 24 hours) at 12/09/2020 1419 Last data filed at 12/08/2020 1630 Gross per 24 hour  Intake 240.97 ml  Output --  Net 240.97 ml   Filed Weights   12/04/20 1600  Weight: 61.9 kg   Examination: Physical Exam:  Constitutional: Thin elderly Caucasian male who is extremely upset and agitated and combative and confused at this  time. Eyes: Lids and conjunctivae normal, sclerae anicteric  ENMT: External Ears, Nose appear normal.  Neck: Appears normal, supple, no cervical masses, normal ROM, no appreciable thyromegaly; no JVD Respiratory: Diminished to auscultation bilaterally, no wheezing, rales, rhonchi or crackles. Normal respiratory effort and patient is not tachypenic. No accessory muscle use.  Unlabored breathing Cardiovascular: RRR, no murmurs / rubs / gallops. S1 and S2 auscultated. No extremity edema. Abdomen: Soft, non-tender, non-distended.  Bowel sounds positive.  GU: Deferred. Musculoskeletal: No clubbing / cyanosis of digits/nails. No joint deformity upper and lower extremities.  Skin: Significant bruising and ecchymosis on the left side of his hip and leg.  Hip incisions appear clean dry and intact and there is no induration; Warm and dry.  Neurologic: Patient is extremely confused and does not follow commands currently  Psychiatric: Impaired judgment and insight.  He is awake but he is not alert and oriented x 3.  Significantly confused and agitated mood and appropriate affect.   Data Reviewed: I have personally reviewed following labs and imaging  studies  CBC: Recent Labs  Lab 12/06/2020 1700 2020/08/18 0530 12/04/20 0311 12/05/20 0221 12/07/20 0803 12/08/20 0918 12/09/20 0206  WBC 5.2   < > 15.5* 12.5* 7.9 8.6 6.2  NEUTROABS 4.1  --  13.8* 10.7*  --  6.8 4.8  HGB 14.2   < > 11.4* 10.0* 9.2* 10.7* 9.9*  HCT 43.1   < > 36.1* 30.3* 27.6* 33.6* 31.1*  MCV 98.4   < > 100.6* 97.4 98.9 100.3* 100.3*  PLT 154   < > 139* 134* 162 211 183   < > = values in this interval not displayed.   Basic Metabolic Panel: Recent Labs  Lab 12/04/20 0311 12/05/20 0221 12/07/20 0803 12/08/20 0254 12/08/20 0918 12/09/20 0206  NA 141 140 141 143  --  141  K 4.0 4.3 3.6 3.5  --  3.6  CL 103 102 104 103  --  105  CO2 22 25 26 27   --  27  GLUCOSE 141* 116* 102* 98  --  99  BUN 25* 36* 27* 26*  --  23  CREATININE 1.71* 1.63* 1.12 1.19  --  1.06  CALCIUM 8.3* 8.4* 8.5* 8.6*  --  8.0*  MG 1.6*  --   --   --  2.0 1.9  PHOS 4.5  --   --   --  3.1 3.4   GFR: Estimated Creatinine Clearance: 38.9 mL/min (by C-G formula based on SCr of 1.06 mg/dL). Liver Function Tests: Recent Labs  Lab 12/04/20 0311 12/05/20 0221 12/07/20 0803 12/08/20 0918 12/09/20 0206  AST 52* 86* 50* 44* 31  ALT 21 13 14 16 14   ALKPHOS 72 69 67 81 71  BILITOT 0.9 1.0 1.5* 2.1* 1.5*  PROT 5.1* 5.4* 5.2* 6.0* 5.2*  ALBUMIN 2.9* 3.0* 2.6* 3.1* 2.7*   No results for input(s): LIPASE, AMYLASE in the last 168 hours. No results for input(s): AMMONIA in the last 168 hours. Coagulation Profile: Recent Labs  Lab 2020/08/18 0530  INR 1.1   Cardiac Enzymes: No results for input(s): CKTOTAL, CKMB, CKMBINDEX, TROPONINI in the last 168 hours. BNP (last 3 results) No results for input(s): PROBNP in the last 8760 hours. HbA1C: No results for input(s): HGBA1C in the last 72 hours. CBG: Recent Labs  Lab 2020/08/18 2108  GLUCAP 136*   Lipid Profile: No results for input(s): CHOL, HDL, LDLCALC, TRIG, CHOLHDL, LDLDIRECT in the last 72 hours. Thyroid Function Tests: No results  for input(s): TSH, T4TOTAL, FREET4, T3FREE, THYROIDAB in the last 72 hours. Anemia Panel: Recent Labs    12/09/20 0206  VITAMINB12 157*  FOLATE 29.2  FERRITIN 117  TIBC 235*  IRON 45  RETICCTPCT 3.1   Sepsis Labs: No results for input(s): PROCALCITON, LATICACIDVEN in the last 168 hours.  Recent Results (from the past 240 hour(s))  Resp Panel by RT-PCR (Flu A&B, Covid) Nasopharyngeal Swab     Status: None   Collection Time: 12-15-20  3:52 PM   Specimen: Nasopharyngeal Swab; Nasopharyngeal(NP) swabs in vial transport medium  Result Value Ref Range Status   SARS Coronavirus 2 by RT PCR NEGATIVE NEGATIVE Final    Comment: (NOTE) SARS-CoV-2 target nucleic acids are NOT DETECTED.  The SARS-CoV-2 RNA is generally detectable in upper respiratory specimens during the acute phase of infection. The lowest concentration of SARS-CoV-2 viral copies this assay can detect is 138 copies/mL. A negative result does not preclude SARS-Cov-2 infection and should not be used as the sole basis for treatment or other patient management decisions. A negative result may occur with  improper specimen collection/handling, submission of specimen other than nasopharyngeal swab, presence of viral mutation(s) within the areas targeted by this assay, and inadequate number of viral copies(<138 copies/mL). A negative result must be combined with clinical observations, patient history, and epidemiological information. The expected result is Negative.  Fact Sheet for Patients:  BloggerCourse.com  Fact Sheet for Healthcare Providers:  SeriousBroker.it  This test is no t yet approved or cleared by the [place] FDA and  has been authorized for detection and/or diagnosis of SARS-CoV-2 by FDA under an Emergency Use Authorization (EUA). This EUA will remain  in effect (meaning this test can be used) for the duration of the COVID-19 declaration under Section  564(b)(1) of the Act, 21 U.S.C.section 360bbb-3(b)(1), unless the authorization is terminated  or revoked sooner.       Influenza A by PCR NEGATIVE NEGATIVE Final   Influenza B by PCR NEGATIVE NEGATIVE Final    Comment: (NOTE) The Xpert Xpress SARS-CoV-2/FLU/RSV plus assay is intended as an aid in the diagnosis of influenza from Nasopharyngeal swab specimens and should not be used as a sole basis for treatment. Nasal washings and aspirates are unacceptable for Xpert Xpress SARS-CoV-2/FLU/RSV testing.  Fact Sheet for Patients: BloggerCourse.com  Fact Sheet for Healthcare Providers: SeriousBroker.it  This test is not yet approved or cleared by the [place] FDA and has been authorized for detection and/or diagnosis of SARS-CoV-2 by FDA under an Emergency Use Authorization (EUA). This EUA will remain in effect (meaning this test can be used) for the duration of the COVID-19 declaration under Section 564(b)(1) of the Act, 21 U.S.C. section 360bbb-3(b)(1), unless the authorization is terminated or revoked.  Performed at [facility], [address] [place], [place] [address]      RN Pressure Injury Documentation:     Estimated body mass index is 20.15 kg/m as calculated from the following:   Height as of this encounter:  (1.753 m).   Weight as of this encounter: 61.9 kg.  Malnutrition Type:   Malnutrition Characteristics:   Nutrition Interventions:     Radiology Studies: No results found. Scheduled Meds: . enoxaparin (LOVENOX) injection  30 mg Subcutaneous Q24H  . lisinopril  10 mg Oral Daily  . metoprolol tartrate  12.5 mg Oral BID  . OLANZapine zydis  5 mg Oral Daily  . polyethylene glycol  17 g Oral Daily  Continuous Infusions:   LOS: 7 days   Merlene Laughter, DO [facility] PAGER is on AMION  If 7PM-7AM, please contact night-coverage [email or web address]

## 2020-12-09 NOTE — Plan of Care (Signed)

## 2020-12-09 NOTE — TOC Progression Note (Signed)
Transition of Care [facility]) - Progression Note    Patient Details  Name: Tom Robertson MRN: [id number] Date of Birth: 07-13-1926  [facility] CM/SW Contact  Janae Bridgeman, RN Phone Number: 12/09/2020, 9:07 AM  Clinical Narrative:    Case management spoke with the patient's son, Hannibal Skalla, on the phone regarding Medicare choice for SNF placement.  The patient's son chose [facility] in [place].  I called and spoke with Lawson Fiscal, CM with [facility] and the facility is starting insurance authorization with [facility] since the patient's insurance provider is not managed through [facility].  [employer] authorization is likely to take 7 days for approval according to the facility - and I made the son aware of this.   Expected Discharge Plan: Skilled Nursing Facility Barriers to Discharge: Continued Medical Work up  Expected Discharge Plan and Services Expected Discharge Plan: Skilled Nursing Facility In-house Referral: Clinical Social Work Discharge Planning Services: CM Consult Post Acute Care Choice: Skilled Nursing Facility Living arrangements for the past 2 months: Independent Living Facility ([facility])                                       Social Determinants of Health (SDOH) Interventions    Readmission Risk Interventions Readmission Risk Prevention Plan 12/07/2020  Post Dischage Appt Complete  Medication Screening Complete  Transportation Screening Complete  Some recent data might be hidden

## 2020-12-09 NOTE — Progress Notes (Signed)
PT Cancellation Note  Patient Details Name: Tom Robertson MRN: [id number] DOB: [birth year]   Cancelled Treatment:    Reason Eval/Treat Not Completed: Fatigue/lethargy limiting ability to participate Held off on therapy earlier this AM per staff request to allow pt to sleep. Pt received Dilaudid earlier this AM. On therapy attempts this afternoon, niece reports pt has only been asleep 2 hours. Pt sleeping soundly and unable to be aroused. Will re-attempt session tomorrow.   Gregor Hams, PT, DPT Acute Rehabilitation Services Pager [phone] Office [phone]    Elissa Lovett 12/09/2020, 1:54 PM

## 2020-12-09 NOTE — Progress Notes (Signed)
Ortho Trauma Progress Note  SUBJECTIVE: Extremely confused and combative overnight, Ativan given with no effect.  Patient remained agitated even after speaking on the phone with his son [name].  This morning patient continues to be disoriented but is currently resting comfortably.  Patient's son Kathlene November is at bedside, who states that his dad fell asleep only about 5 minutes ago.  I did not wake patient for exam as he has been restless/agitated all night and just recently fell asleep.  OBJECTIVE: Vitals:   12/08/20 1500 12/08/20 1900 12/09/20 0430 12/09/20 0735  BP: (!) 161/88 (!) 165/84 (!) 165/86 (!) 150/98  Pulse: 88 78 67 76  Resp: 18 15 18 17   Temp: 98.6 F (37 C) 98.3 F (36.8 C) 97.6 F (36.4 C) 97.7 F (36.5 C)  TempSrc: Oral Oral Oral Axillary  SpO2: 100% 100% 99% 95%  Weight:      Height:       General: Laying in bed, NAD Resp: No increased WOB LLE: Incisions C/D/I, wound edges well approximated..  Significant bruising over lateral hip and thigh, this is expected. Neurovascularly intact. +DP pulse. Compartments soft and compressible    ASSESSMENT: 86 year old male s/p fall, 6 Days Post-Op s/p intramedullary nailing right femur   PLAN: Weight Bearing: Weight Bearing as Tolerated (WBAT) LLE Incisional care: Ok to leave incisions open to air Showering: Ok to begin showering with assistance from ortho standpoint VTE prophylaxis: Lovenox, SCDs, ambulation Foley/IVFs: No foley, KVO IVFs Dispo: Therapies as tolerated, PT/OT recommending SNF.  Patient sleeping currently, will plan to follow-up later today.  Ok for d/c from ortho standpoint once cleared by medicine team and therapies. D/C rx for pain medication and DVT prophylaxis signed and placed in chart Follow up: 2 weeks for repeat x-rays and wound check   Lonni Dirden [address] [facility] [phone] (office) [email or web address]

## 2020-12-10 DIAGNOSIS — M810 Age-related osteoporosis without current pathological fracture: Secondary | ICD-10-CM | POA: Diagnosis not present

## 2020-12-10 DIAGNOSIS — S72142A Displaced intertrochanteric fracture of left femur, initial encounter for closed fracture: Secondary | ICD-10-CM | POA: Diagnosis not present

## 2020-12-10 DIAGNOSIS — W19XXXA Unspecified fall, initial encounter: Secondary | ICD-10-CM | POA: Diagnosis not present

## 2020-12-10 DIAGNOSIS — I10 Essential (primary) hypertension: Secondary | ICD-10-CM | POA: Diagnosis not present

## 2020-12-10 LAB — CBC WITH DIFFERENTIAL/PLATELET
Abs Immature Granulocytes: 0.07 10*3/uL (ref 0.00–0.07)
Basophils Absolute: 0 10*3/uL (ref 0.0–0.1)
Basophils Relative: 0 %
Eosinophils Absolute: 0.1 10*3/uL (ref 0.0–0.5)
Eosinophils Relative: 1 %
HCT: 33.3 % — ABNORMAL LOW (ref 39.0–52.0)
Hemoglobin: 11 g/dL — ABNORMAL LOW (ref 13.0–17.0)
Immature Granulocytes: 1 %
Lymphocytes Relative: 6 %
Lymphs Abs: 0.5 10*3/uL — ABNORMAL LOW (ref 0.7–4.0)
MCH: 32.7 pg (ref 26.0–34.0)
MCHC: 33 g/dL (ref 30.0–36.0)
MCV: 99.1 fL (ref 80.0–100.0)
Monocytes Absolute: 0.8 10*3/uL (ref 0.1–1.0)
Monocytes Relative: 8 %
Neutro Abs: 8.2 10*3/uL — ABNORMAL HIGH (ref 1.7–7.7)
Neutrophils Relative %: 84 %
Platelets: 258 10*3/uL (ref 150–400)
RBC: 3.36 MIL/uL — ABNORMAL LOW (ref 4.22–5.81)
RDW: 13.4 % (ref 11.5–15.5)
WBC: 9.7 10*3/uL (ref 4.0–10.5)
nRBC: 0 % (ref 0.0–0.2)

## 2020-12-10 LAB — COMPREHENSIVE METABOLIC PANEL
ALT: 16 U/L (ref 0–44)
AST: 32 U/L (ref 15–41)
Albumin: 3 g/dL — ABNORMAL LOW (ref 3.5–5.0)
Alkaline Phosphatase: 82 U/L (ref 38–126)
Anion gap: 11 (ref 5–15)
BUN: 24 mg/dL — ABNORMAL HIGH (ref 8–23)
CO2: 27 mmol/L (ref 22–32)
Calcium: 8.4 mg/dL — ABNORMAL LOW (ref 8.9–10.3)
Chloride: 104 mmol/L (ref 98–111)
Creatinine, Ser: 1.11 mg/dL (ref 0.61–1.24)
GFR, Estimated: >60 mL/min (ref >60)
Glucose, Bld: 114 mg/dL — ABNORMAL HIGH (ref 70–99)
Potassium: 4 mmol/L (ref 3.5–5.1)
Sodium: 142 mmol/L (ref 135–145)
Total Bilirubin: 1.8 mg/dL — ABNORMAL HIGH (ref 0.3–1.2)
Total Protein: 5.7 g/dL — ABNORMAL LOW (ref 6.5–8.1)

## 2020-12-10 LAB — PHOSPHORUS: Phosphorus: 3.9 mg/dL (ref 2.5–4.6)

## 2020-12-10 LAB — MAGNESIUM: Magnesium: 2.1 mg/dL (ref 1.7–2.4)

## 2020-12-10 MED ORDER — HYDROXYZINE HCL 25 MG PO TABS
25.0000 mg | ORAL_TABLET | Freq: Three times a day (TID) | ORAL | Status: DC | PRN
  Administered 2020-12-10 – 2020-12-11 (×2): 25 mg via ORAL
  Filled 2020-12-10 (×2): qty 1

## 2020-12-10 NOTE — Progress Notes (Signed)
Occupational Therapy Treatment Patient Details Name: Tom Robertson MRN: [id number] DOB: [birth year] Today's Date: 12/10/2020    History of present illness Tom Robertson is a 86 y.o. male with medical history significant of hypertension who presents with hip pain following a fall from standing height sustaining a L intertrochanteric femur fx. Patient s/p cephalomedullary L femur fx 12/23.   OT comments  Pt with slow progress towards goals. Pt premedicated prior to session and lethargic throughout. Pt able to open eyes and answer questions but difficulty following directions. Pt overall Total A x 2 for bed mobility and sit EOB for >5 minutes. Pt requires Min A to maintain sitting balance EOB, heavily reliant on R UE support on bed rail and resistance met when attempting to bring pt to sitting midline. Unable to safely attempt standing today due to lethargy. Hope to progress to further transfer training during next session as appropriate.    Follow Up Recommendations  SNF    Equipment Recommendations  None recommended by OT    Recommendations for Other Services      Precautions / Restrictions Precautions Precautions: Fall Restrictions Weight Bearing Restrictions: No LLE Weight Bearing: Weight bearing as tolerated       Mobility Bed Mobility Overal bed mobility: Needs Assistance Bed Mobility: Supine to Sit;Sit to Supine     Supine to sit: HOB elevated;Total assist;+2 for physical assistance;+2 for safety/equipment Sit to supine: Total assist;+2 for physical assistance;+2 for safety/equipment   General bed mobility comments: Total A x 2 for for all bed mobility due to lethargy and pt noted to push back from manual assist at times. Total A x 2 to scoot back up in bed  Transfers                 General transfer comment: deferred due to lethargy    Balance Overall balance assessment: Needs assistance;History of Falls Sitting-balance support: Bilateral upper  extremity supported;Feet supported Sitting balance-Leahy Scale: Poor Sitting balance - Comments: R UE supported on rail, R lateral lean. Would push back to R side if attempted to bring to midline                                   ADL either performed or assessed with clinical judgement   ADL Overall ADL's : Needs assistance/impaired                     Lower Body Dressing: Total assistance;+2 for physical assistance;+2 for safety/equipment;Sit to/from stand Lower Body Dressing Details (indicate cue type and reason): Total A for LB dressing bed level               General ADL Comments: Limited due to pain and lethargy     Vision   Vision Assessment?: No apparent visual deficits   Perception     Praxis      Cognition Arousal/Alertness: Lethargic;Suspect due to medications Behavior During Therapy: Flat affect Overall Cognitive Status: Impaired/Different from baseline Area of Impairment: Orientation;Memory;Following commands;Awareness;Problem solving;Attention;Safety/judgement                 Orientation Level: Disoriented to;Place;Time;Situation Current Attention Level: Sustained Memory: Decreased short-term memory Following Commands: Follows one step commands with increased time;Follows multi-step commands inconsistently Safety/Judgement: Decreased awareness of safety;Decreased awareness of deficits Awareness: Intellectual Problem Solving: Difficulty sequencing;Requires verbal cues;Requires tactile cues General Comments: Pt with flat affect overall, lethargic after dilaudid  administration. does yell out in pain, answers most questions with garbled speech        Exercises     Shoulder Instructions       General Comments Son present during session and supportive    Pertinent Vitals/ Pain       Pain Assessment: Faces Faces Pain Scale: Hurts even more Pain Location: L hip with movement Pain Descriptors / Indicators:  Grimacing;Guarding Pain Intervention(s): Premedicated before session;Monitored during session;Repositioned;Limited activity within patient's tolerance  Home Living                                          Prior Functioning/Environment              Frequency  Min 2X/week        Progress Toward Goals  OT Goals(current goals can now be found in the care plan section)  Progress towards OT goals: Progressing toward goals  Acute Rehab OT Goals Patient Stated Goal: did not state OT Goal Formulation: Patient unable to participate in goal setting Time For Goal Achievement: 12/18/20 Potential to Achieve Goals: Good ADL Goals Pt Will Perform Lower Body Dressing: with min guard assist;sit to/from stand Pt Will Transfer to Toilet: with min guard assist;regular height toilet;ambulating;grab bars Pt Will Perform Toileting - Clothing Manipulation and hygiene: with supervision;sit to/from stand Additional ADL Goal #1: Patient will perform 10 min functional activity or exercise activity as evidence of improving activity tolerance Additional ADL Goal #2: Patient will sit edge of bed to perform grooming task with supervision only.  Plan Discharge plan remains appropriate    Co-evaluation    PT/OT/SLP Co-Evaluation/Treatment: Yes Reason for Co-Treatment: Necessary to address cognition/behavior during functional activity;For patient/therapist safety;To address functional/ADL transfers   OT goals addressed during session: ADL's and self-care;Strengthening/ROM      AM-PAC OT "6 Clicks" Daily Activity     Outcome Measure   Help from another person eating meals?: A Little Help from another person taking care of personal grooming?: A Little Help from another person toileting, which includes using toliet, bedpan, or urinal?: Total Help from another person bathing (including washing, rinsing, drying)?: A Lot Help from another person to put on and taking off regular upper  body clothing?: A Little Help from another person to put on and taking off regular lower body clothing?: Total 6 Click Score: 13    End of Session    OT Visit Diagnosis: Unsteadiness on feet (R26.81);Other abnormalities of gait and mobility (R26.89);History of falling (Z91.81);Pain Pain - Right/Left: Left Pain - part of body: Hip   Activity Tolerance Patient limited by lethargy   Patient Left in bed;with call bell/phone within reach;with bed alarm set;with family/visitor present   Nurse Communication Mobility status        Time: 9735-3299 OT Time Calculation (min): 24 min  Charges: OT General Charges $OT Visit: 1 Visit OT Treatments $Therapeutic Activity: 8-22 mins  Layla Maw, OTR/L   Layla Maw 12/10/2020, 3:09 PM

## 2020-12-10 NOTE — Progress Notes (Signed)
Physical Therapy Treatment Patient Details Name: Tom Robertson MRN: [id number] DOB: 05/30/1926 Today's Date: 12/10/2020    History of Present Illness Tom Robertson is a 86 y.o. male with medical history significant of hypertension who presents with hip pain following a fall from standing height sustaining a L intertrochanteric femur fx. Patient s/p cephalomedullary L femur fx 12/23.    PT Comments    Patient with increased lethargy this session suspect due to medications. Patient intermittently followed commands, but kept eyes open and answered questions during session. This session, patient overall totalA+2 for bed mobility and sat EOB 10 minutes with minA progressing to min guard, heavy reliance on R UE support with R lateral lean and resisted when attempting to bring to midline. Deferred standing this session due to lethargy. Continue to recommend SNF for ongoing Physical Therapy.      Follow Up Recommendations  SNF;Supervision/Assistance - 24 hour     Equipment Recommendations  Other (comment) (defer to post acute rehab)    Recommendations for Other Services       Precautions / Restrictions Precautions Precautions: Fall Restrictions Weight Bearing Restrictions: Yes LLE Weight Bearing: Weight bearing as tolerated    Mobility  Bed Mobility Overal bed mobility: Needs Assistance Bed Mobility: Supine to Sit;Sit to Supine     Supine to sit: HOB elevated;Total assist;+2 for physical assistance;+2 for safety/equipment Sit to supine: Total assist;+2 for physical assistance;+2 for safety/equipment   General bed mobility comments: Total A x 2 for for all bed mobility due to lethargy and pt noted to push back from manual assist at times. Total A x 2 to scoot back up in bed  Transfers                 General transfer comment: deferred due to lethargy  Ambulation/Gait                 Stairs             Wheelchair Mobility    Modified Rankin  (Stroke Patients Only)       Balance Overall balance assessment: Needs assistance;History of Falls Sitting-balance support: Bilateral upper extremity supported;Feet supported Sitting balance-Leahy Scale: Poor Sitting balance - Comments: R UE supported on rail, R lateral lean. Would push back to R side if attempted to bring to midline. Sat EOB 10 minutes with modA progressing to min guard                                    Cognition Arousal/Alertness: Lethargic;Suspect due to medications Behavior During Therapy: Flat affect Overall Cognitive Status: Impaired/Different from baseline Area of Impairment: Orientation;Memory;Following commands;Awareness;Problem solving;Attention;Safety/judgement                 Orientation Level: Disoriented to;Place;Time;Situation Current Attention Level: Sustained Memory: Decreased short-term memory Following Commands: Follows one step commands with increased time;Follows multi-step commands inconsistently Safety/Judgement: Decreased awareness of safety;Decreased awareness of deficits Awareness: Intellectual Problem Solving: Difficulty sequencing;Requires verbal cues;Requires tactile cues General Comments: Pt with flat affect overall, lethargic after dilaudid administration. does yell out in pain, answers most questions with garbled speech      Exercises      General Comments General comments (skin integrity, edema, etc.): Son present during session and supportive. Attempted LE exercises seated EOB but patient with difficulty following commands      Pertinent Vitals/Pain Pain Assessment: Faces Faces Pain Scale: Hurts even  more Pain Location: L hip with movement Pain Descriptors / Indicators: Grimacing;Guarding Pain Intervention(s): Premedicated before session;Monitored during session;Limited activity within patient's tolerance;Repositioned    Home Living                      Prior Function            PT Goals  (current goals can now be found in the care plan section) Acute Rehab PT Goals Patient Stated Goal: did not state PT Goal Formulation: With patient Time For Goal Achievement: 12/18/20 Potential to Achieve Goals: Fair Progress towards PT goals: Progressing toward goals    Frequency    Min 3X/week      PT Plan Current plan remains appropriate    Co-evaluation PT/OT/SLP Co-Evaluation/Treatment: Yes Reason for Co-Treatment: Necessary to address cognition/behavior during functional activity;For patient/therapist safety;To address functional/ADL transfers PT goals addressed during session: Mobility/safety with mobility;Balance OT goals addressed during session: ADL's and self-care;Strengthening/ROM      AM-PAC PT "6 Clicks" Mobility   Outcome Measure  Help needed turning from your back to your side while in a flat bed without using bedrails?: A Lot Help needed moving from lying on your back to sitting on the side of a flat bed without using bedrails?: A Lot Help needed moving to and from a bed to a chair (including a wheelchair)?: A Lot Help needed standing up from a chair using your arms (e.g., wheelchair or bedside chair)?: A Lot Help needed to walk in hospital room?: Total Help needed climbing 3-5 steps with a railing? : Total 6 Click Score: 10    End of Session   Activity Tolerance: Patient limited by lethargy Patient left: in bed;with call bell/phone within reach;with bed alarm set;with family/visitor present Nurse Communication: Mobility status PT Visit Diagnosis: Unsteadiness on feet (R26.81);Other abnormalities of gait and mobility (R26.89);Muscle weakness (generalized) (M62.81);History of falling (Z91.81)     Time: 9147-8295 PT Time Calculation (min) (ACUTE ONLY): 24 min  Charges:  $Therapeutic Activity: 8-22 mins                     Tom Robertson, PT, DPT Acute Rehabilitation Services Pager [phone] Office [phone]    Tom Robertson 12/10/2020,  3:13 PM

## 2020-12-10 NOTE — Progress Notes (Signed)
Speech Language Pathology Treatment: Dysphagia  Patient Details Name: Tom Robertson MRN: [id number] DOB: [birth year] Today's Date: 12/10/2020 Time: 0950-1000 SLP Time Calculation (min) (ACUTE ONLY): 10 min  Assessment / Plan / Recommendation Clinical Impression  Pt more alert and participatory today. Dtr-in-law at bedside.  We discussed the likely chronicity of Tom Robertson swallowing deficits and trace aspiration noted on 12/27 MBS, as well as his ability thus far to tolerate occasional aspiration without detrimental effects (lung sounds stable, no pna).  We talked about potential for developing aspiration pna in the future, but that as of now, he is tolerating diet well. Pt has had a baseline cough when eating for some time now, per his dtr-in-law.  He consumed pureed pineapple and sips of thin water during our session with one mild cough, but no significant s/s of dysphagia.  Recommend continuing dysphagia 2 diet, thin liquids, crushed meds (due to vallecular residue) upon D/C to SNF rehab. Staff should not be alarmed by occasional coughing when eating.   No further acute care SLP f/u is warranted - our service will sign off.   HPI HPI: Pt is a 86 y.o. male with medical history significant of hypertension who presented with hip pain following a fall from standing height and inability to walk since the fall. Pt sustained intertrochanteric fracture of the left femur and is s/p surgery. Abdominal imaging: Nonobstructive bowel gas pattern. CT head negative for acute changes. Case d/w with Tom Perch, RN who stated that the pt spat out his medication and will not suck from a straw; RN reported being concerned about the pt aspirating and therefore paged the MD for a swallow evaluation.      SLP Plan  All goals met       Recommendations  Diet recommendations: Dysphagia 2 (fine chop);Thin liquid Liquids provided via: Cup;Straw Medication Administration: Crushed with puree Compensations: Slow  rate;Small sips/bites;Clear throat intermittently                Oral Care Recommendations: Oral care BID Follow up Recommendations: Skilled Nursing facility SLP Visit Diagnosis: Dysphagia, oropharyngeal phase (R13.12) Plan: All goals met                     Tom Robertson Tom Robertson, Tom Robertson CCC/SLP Acute Rehabilitation Services Office number [phone] Pager [phone]   Tom Robertson 12/10/2020, 10:03 AM

## 2020-12-10 NOTE — Plan of Care (Signed)

## 2020-12-10 NOTE — Progress Notes (Signed)
The patient's family was educated on pain medication and regimen.  The family member was under the impression that the patient was to get dilaudid every 4 hours.  I have explained to them that the pain medication is as needed and the patient will have to ask or show signs of needing the medication.  While in the patient's room, the patient is snoring.  The patient's daughter in law also asked to have medication ordered for anxiety.  Dr Marland Mcalpine was notified via secure chat message and an order was received.

## 2020-12-10 NOTE — Progress Notes (Signed)
PROGRESS NOTE    CHRISTIN MCCREEDY  MWN:[id number] DOB: [birth year] DOA: 11/12/2020 PCP: Joycelyn Man, NP   Brief Narrative:  HPI per Dr. Beola Cord on 12/06/2020 [name] a 86 y.o.malewith medical history significant ofhypertensionwho presents with hip pain following a fall from standing height. Patient was reaching to pick up a newspaper from the ground and fell on his left side. He reports severe left hip pain which is worse with any movement. Pain is 10 out of 10 at its worst but is improved to moderate pain now.He is been unable to walk since the fall. He does walk with a cane at baseline. He denies any head trauma or taking any anticoagulation. He has 2 sons who the EDP spoke with who confirmed that he is on minimal medications only antihypertensive, aspirin and vitamins. He denies fever, cough, chest pain, shortness of breath, abdominal pain, constipation, diarrhea, nausea.  ED Course:Vitals in ED significant for intermittent hypertension in the 140s to 190s systolic. Some readings of low heart rate in the upper 50s. Lab work-up showed BMP that was normal, LFTs with calcium of 8.6which corrects considering albumin of 3.1, protein of 5.4. CBC within normal notes. Respiratory panel for flu and Covid pending. Patient was given pain medication and and orthopedics was consulted for his fracture. Chest x-ray was without acute disease. DG pelvis and DG left femur showed left intratrochanteric hip fracture. CT head was without acute process, but incidental hyperdense region noticed which may need follow-up MRI. CT C-spine showed multilevel spondylosis but no acute fracture. CT abdomen redemonstrated left femoral fracture otherwise showed no acute abdominal or pelvic process diverticulosis without diverticulitis noted  **Interim History Patient was taken for surgical intervention for his intertrochanteric fracture of the left femur the setting of his  acute fall on December 27, 2020.  PT OT will need to evaluate the patient after surgical intervention to determine disposition and recommending SNF.  He was deemed medically stable to be discharged yesterday but overnight became extremely confused and delirious.  Is very agitated this morning and given Zyprexa.  We have placed him on delirium precautions if he continues to be confused will work-up further with imaging currently feels that this is related to acute delirium.  Assessment & Plan:   Principal Problem:   Closed comminuted intertrochanteric fracture of left femur (HCC) Active Problems:   Fall at home, initial encounter   HTN (hypertension)   Osteoporosis  Acute Fall Osteoporosis Intertrochanteric fracture of the left femur in the setting of Fall status post cephalomedullary nailing on 12/04/2019 -Patient had a fall from standing height while reaching to pick a newspaper up off the ground. No preceding symptoms no loss of consciousness typically walks with a cane. -Did not hit his head, not on anticoagulation -Orthopedic surgery evaluated and patient was taken to surgery on 27-Dec-2020 -With fracture from fall from standing height meets definition for osteoporosis. -Appreciate orthopedics recommendations -Pain control initiated and c/w Hydrocodone-Acetaminophen 1 tab po q6hprn Moderate Pain, and Hydromorphone 0.5 mg IV q4hprn Severe Pain; Given IV Morphine 2 mg in the ED -C/w Miralax 17 grams po daily as well as Dailyprn -Hip fracture precautions -Takes Aspirin 81 mg po Daily at Home. Will hold for surgery  -C/w Supportive care and getting IVF with NS at 75 mL/hr; given a 500 mL bolus in the ED -AM labs repeated, check vitamin D level -Further Care per Orthopedic Surgery and Dr. Jena Gauss evaluated and recommended that the patient will require a cephalomedullary nailing  of his left hip fracture.  Risks and benefits were discussed and he underwent surgical intervention on 11/19/2020  afternoon. -Orthopedic surgery recommending weightbearing as tolerated on the left lower extremity and maintaining his Mepilex dressings and recommending Lovenox and SCDs as well as ambulation for VT prophylaxis as well as enoxaparin 30 mg subcu every 12 hours -PT OT recommending skilled nursing facility -At baseline he walks with a cane -Patient will need to follow-up with Dr. Jena Gauss at discharge in 2 weeks  Acute Delirium with significant confusion and agitation, improved  -Overnight to get out of beds and started having loud outbursts.  He was given 1 mg of IM Ativan the night before last and security was called to help calm him down  -Bladder scan to assess if the patient is retaining urine and he was Retaining; Given an I and O Cath -Labs are within normal limits and he is not hyponatremic hypokalemia and renal function is improved  -Given p.o. Zyprexa 5 mg sublingually and will continue daily  -continue to reorient frequently and continue with delirium precautions -Have family sitter at bedside and he did well overnight  -Continue with bowel regimen and pain control -I have removed his restraints and patient was not actually ever restrained overnight -If he is not improving may work-up further with head imaging and an EEG but will hold off as he is much improved today   Hypertension -Intermittently hypertensive in ED; Last blood Pressure was  -Patient takes Lisinopril 20 mg po Daily at home and was held initially but resumed at 10 mg p.o. daily -Continue metoprolol tartrate 12.5 milligrams p.o. twice daily -If needed can add on IV Hydralazine -Continue to monitor blood pressures per protocol and last blood pressure was 158/74  Hyperglycemia in the setting of prediabetes -Patient's Blood Sugar has been elevated on Daily BMP/CMP; on Admission was 101 and repeat was 136  -Check HbA1c and was 5.7 -Continue to Monitor Blood Sugars carefully and if Necessary will place on Sensitive  Novolog SSI AC -Patient's blood sugars have been ranging from 98-141 daily on BMP/CMP; Blood Sugar was 114  Dysphagia/Concern for Aspiration -Nursing had noted that he was gagging coughing up with an RN try to give him fluids and he spit out his medications and did not suck from straw -SLP evaluated and recommended MBS -MBS done and they recommended dysphagia 2 diet with thin liquids and no straws  Thrombocytopenia -Mild as Platelet Count went from 154 -> 145 and is now improved further and is now normal at 183 -Continue to Monitor for S/Sx of Bleeding; Currently no overt bleeding noted -Repeat CBC in the AM   Hyperbilirubinemia -Patient's T bili was 2.1 is now improved to 1.5 yesterday and today is 1.8 -Likely reactive -Patient's direct bilirubin is 0.4 and indirect was 1.7 on 12/08/2020 -Continue to Monitor trend and repeat CMP in a.m.  AKI on likely CKD stage IIIa -Patient BUNs/creatinine trended up to 25/1.71 -Improved with IV fluid hydration is now trended down to 24/1.11; IV fluid hydration is now stopped -Continue monitor and trend and avoid nephrotoxic medications, contrast dyes, hypotension and renally dose medications -Repeat CMP in a.m.  Macrocytic Anemia -Postoperatively patient's hemoglobin/hematocrit remained stable at 11.0/32.3 and MCV is gone from 100.3 and is now 99.1 -Check anemia panel and showed an iron level of 45, U IBC of 190, TIBC of 235, saturation ratios of 19%, ferritin level 117, folate level 29.2, and vitamin B12 level is 157 -Continue to monitor for signs  and symptoms of bleeding; currently no overt bleeding noted -Repeat CBC in a.m.  HLD -Takes Atorvastatin 20 mg po Daily and will resume  CT findings -CT head was without acute process, but incidental 1.7 cm hyperdense region noticed in the Left Frontal White matter that could reflect an area of dysplasia or vascular malformation -Follow-up nonemergent MRI with and without contrast recommended to  better characterize and exclude underlying mass lesion  -Can be done after Hip procedure or in the outpatient setting  -Dr. Ashley Royalty spoke with the patient's son and they did not want to pursue further work-up on this  GOC: DNR, poA  DVT prophylaxis: Enoxaparin 30 mg subcu every 24 Code Status: DO NOT RESUSCITATE  Family Communication: Discussed with son Kathlene November Disposition Plan: SNF when bed is available as he is medically stable to be discharged now that his delirium has improved significantly  Status is: Inpatient  Remains inpatient appropriate because:Unsafe d/c plan, IV treatments appropriate due to intensity of illness or inability to take PO and Inpatient level of care appropriate due to severity of illness   Dispo: The patient is from: Home              Anticipated d/c is to: SNF              Anticipated d/c date is: 1-2 days              Patient currently is medically stable to d/c.   Consultants:   Orthopedic Surgery   Procedures:  CPT 27245-Cephalomedullary nailing of left intertrochanteric femur fracture done by Dr. Jena Gauss on 11/15/2020  Antimicrobials:  Anti-infectives (From admission, onward)   Start     Dose/Rate Route Frequency Ordered Stop   11/27/2020 2200  ceFAZolin (ANCEF) IVPB 2g/100 mL premix        2 g 200 mL/hr over 30 Minutes Intravenous Every 8 hours 11/20/2020 1647 12/04/20 1530   11/16/2020 1450  vancomycin (VANCOCIN) powder  Status:  Discontinued          As needed 11/27/2020 1451 12/11/2020 1543   12/01/2020 1402  ceFAZolin (ANCEF) 2-4 GM/100ML-% IVPB       Note to Pharmacy: Aquilla Hacker   : cabinet override      11/17/2020 1402 12/04/20 0214        Subjective: Seen and examined at bedside and he is much more awake and alert and calm.  Not as agitated and not delirious.  Able to tell me his birthday, who he is and who is sinus.  Appears much calmer today and in no acute distress.  Denies any pain.  No nausea or vomiting.  No other concerns or complaints at  this time and I spoke with the social worker who feels that he likely will be here due to his insurance authorization taking a while.  No other concerns or complaints at this time.  Objective: Vitals:   12/10/20 0549 12/10/20 0825 12/10/20 0959 12/10/20 1000  BP: (!) 143/75 (!) 158/74 (!) 158/74 (!) 158/74  Pulse: 75 68  68  Resp: 16 17    Temp:  97.7 F (36.5 C)    TempSrc:  Oral    SpO2: 96% 100%    Weight:      Height:        Intake/Output Summary (Last 24 hours) at 12/10/2020 1158 Last data filed at 12/09/2020 1500 Gross per 24 hour  Intake --  Output 512 ml  Net -512 ml   [employer]  12/04/20 1600  Weight: 61.9 kg   Examination: Physical Exam:  Constitutional: The patient is a thin elderly Caucasian male currently in no acute distress appears calm and comfortable and does not appear to be in any acute distress and is more awake and alert today than he was yesterday and not combative Eyes: Lids and conjunctivae normal, sclerae anicteric  ENMT: External Ears, Nose appear normal. Grossly normal hearing. Neck: Appears normal, supple, no cervical masses, normal ROM, no appreciable thyromegaly; no JVD Respiratory: Diminished to auscultation bilaterally, no wheezing, rales, rhonchi or crackles. Normal respiratory effort and patient is not tachypenic. No accessory muscle use.  Unlabored breathing Cardiovascular: RRR, no murmurs / rubs / gallops. S1 and S2 auscultated.  No appreciable extremity edema Abdomen: Soft, non-tender, non-distended. Bowel sounds positive.  GU: Deferred. Musculoskeletal: No clubbing / cyanosis of digits/nails. No joint deformity upper and lower extremities.  Skin: Left hip is severely bruised and had ecchymosis. No induration; Warm and dry.  Neurologic: CN 2-12 grossly intact with no focal deficits. Romberg sign and cerebellar reflexes not assessed.  Psychiatric: Normal judgment and insight. Alert and oriented x 2. Normal mood and appropriate affect.    Data Reviewed: I have personally reviewed following labs and imaging studies  CBC: Recent Labs  Lab 12/04/20 0311 12/05/20 0221 12/07/20 0803 12/08/20 0918 12/09/20 0206 12/10/20 0129  WBC 15.5* 12.5* 7.9 8.6 6.2 9.7  NEUTROABS 13.8* 10.7*  --  6.8 4.8 8.2*  HGB 11.4* 10.0* 9.2* 10.7* 9.9* 11.0*  HCT 36.1* 30.3* 27.6* 33.6* 31.1* 33.3*  MCV 100.6* 97.4 98.9 100.3* 100.3* 99.1  PLT 139* 134* 162 211 183 258   Basic Metabolic Panel: Recent Labs  Lab 12/04/20 0311 12/05/20 0221 12/07/20 0803 12/08/20 0254 12/08/20 0918 12/09/20 0206 12/10/20 0129  NA 141 140 141 143  --  141 142  K 4.0 4.3 3.6 3.5  --  3.6 4.0  CL 103 102 104 103  --  105 104  CO2 --  27 27  GLUCOSE 141* 116* 102* 98  --  99 114*  BUN 25* 36* 27* 26*  --  23 24*  CREATININE 1.71* 1.63* 1.12 1.19  --  1.06 1.11  CALCIUM 8.3* 8.4* 8.5* 8.6*  --  8.0* 8.4*  MG 1.6*  --   --   --  2.0 1.9 2.1  PHOS 4.5  --   --   --  3.1 3.4 3.9   GFR: Estimated Creatinine Clearance: 37.2 mL/min (by C-G formula based on SCr of 1.11 mg/dL). Liver Function Tests: Recent Labs  Lab 12/05/20 0221 12/07/20 0803 12/08/20 0918 12/09/20 0206 12/10/20 0129  AST 86* 50* 44* 31 32  ALT ALKPHOS 69 67 81 71 82  BILITOT 1.0 1.5* 2.1* 1.5* 1.8*  PROT 5.4* 5.2* 6.0* 5.2* 5.7*  ALBUMIN 3.0* 2.6* 3.1* 2.7* 3.0*   No results for input(s): LIPASE, AMYLASE in the last 168 hours. No results for input(s): AMMONIA in the last 168 hours. Coagulation Profile: No results for input(s): INR, PROTIME in the last 168 hours. Cardiac Enzymes: No results for input(s): CKTOTAL, CKMB, CKMBINDEX, TROPONINI in the last 168 hours. BNP (last 3 results) No results for input(s): PROBNP in the last 8760 hours. HbA1C: No results for input(s): HGBA1C in the last 72 hours. CBG: Recent Labs  Lab 12/04/2020 2108  GLUCAP 136*   Lipid Profile: No results for input(s): CHOL, HDL, LDLCALC, TRIG, CHOLHDL, LDLDIRECT in  the  last 72 hours. Thyroid Function Tests: No results for input(s): TSH, T4TOTAL, FREET4, T3FREE, THYROIDAB in the last 72 hours. Anemia Panel: Recent Labs    12/09/20 0206  VITAMINB12 157*  FOLATE 29.2  FERRITIN 117  TIBC 235*  IRON 45  RETICCTPCT 3.1   Sepsis Labs: No results for input(s): PROCALCITON, LATICACIDVEN in the last 168 hours.  Recent Results (from the past 240 hour(s))  Resp Panel by RT-PCR (Flu A&B, Covid) Nasopharyngeal Swab     Status: None   Collection Time: 11/17/2020  3:52 PM   Specimen: Nasopharyngeal Swab; Nasopharyngeal(NP) swabs in vial transport medium  Result Value Ref Range Status   SARS Coronavirus 2 by RT PCR NEGATIVE NEGATIVE Final    Comment: (NOTE) SARS-CoV-2 target nucleic acids are NOT DETECTED.  The SARS-CoV-2 RNA is generally detectable in upper respiratory specimens during the acute phase of infection. The lowest concentration of SARS-CoV-2 viral copies this assay can detect is 138 copies/mL. A negative result does not preclude SARS-Cov-2 infection and should not be used as the sole basis for treatment or other patient management decisions. A negative result may occur with  improper specimen collection/handling, submission of specimen other than nasopharyngeal swab, presence of viral mutation(s) within the areas targeted by this assay, and inadequate number of viral copies(<138 copies/mL). A negative result must be combined with clinical observations, patient history, and epidemiological information. The expected result is Negative.  Fact Sheet for Patients:  BloggerCourse.com  Fact Sheet for Healthcare Providers:  SeriousBroker.it  This test is no t yet approved or cleared by the [place] FDA and  has been authorized for detection and/or diagnosis of SARS-CoV-2 by FDA under an Emergency Use Authorization (EUA). This EUA will remain  in effect (meaning this test can be used) for the  duration of the COVID-19 declaration under Section 564(b)(1) of the Act, 21 U.S.C.section 360bbb-3(b)(1), unless the authorization is terminated  or revoked sooner.       Influenza A by PCR NEGATIVE NEGATIVE Final   Influenza B by PCR NEGATIVE NEGATIVE Final    Comment: (NOTE) The Xpert Xpress SARS-CoV-2/FLU/RSV plus assay is intended as an aid in the diagnosis of influenza from Nasopharyngeal swab specimens and should not be used as a sole basis for treatment. Nasal washings and aspirates are unacceptable for Xpert Xpress SARS-CoV-2/FLU/RSV testing.  Fact Sheet for Patients: BloggerCourse.com  Fact Sheet for Healthcare Providers: SeriousBroker.it  This test is not yet approved or cleared by the [place] FDA and has been authorized for detection and/or diagnosis of SARS-CoV-2 by FDA under an Emergency Use Authorization (EUA). This EUA will remain in effect (meaning this test can be used) for the duration of the COVID-19 declaration under Section 564(b)(1) of the Act, 21 U.S.C. section 360bbb-3(b)(1), unless the authorization is terminated or revoked.  Performed at [facility], [address] [place], [place] [address]      RN Pressure Injury Documentation:     Estimated body mass index is 20.15 kg/m as calculated from the following:   Height as of this encounter: 5\' 9"  (1.753 m).   Weight as of this encounter: 61.9 kg.  Malnutrition Type:   Malnutrition Characteristics:   Nutrition Interventions:     Radiology Studies: No results found. Scheduled Meds: . enoxaparin (LOVENOX) injection  30 mg Subcutaneous Q24H  . lisinopril  10 mg Oral Daily  . metoprolol tartrate  12.5 mg Oral BID  . OLANZapine zydis  5 mg Oral Daily  .  polyethylene glycol  17 g Oral Daily   Continuous Infusions:   LOS: 8 days   Merlene Laughtermair Latif Adith Tejada, DO [facility] PAGER is on AMION  If 7PM-7AM, please contact  night-coverage [email or web address]

## 2020-12-11 DIAGNOSIS — M810 Age-related osteoporosis without current pathological fracture: Secondary | ICD-10-CM | POA: Diagnosis not present

## 2020-12-11 DIAGNOSIS — F05 Delirium due to known physiological condition: Secondary | ICD-10-CM

## 2020-12-11 DIAGNOSIS — I10 Essential (primary) hypertension: Secondary | ICD-10-CM | POA: Diagnosis not present

## 2020-12-11 DIAGNOSIS — W19XXXA Unspecified fall, initial encounter: Secondary | ICD-10-CM | POA: Diagnosis not present

## 2020-12-11 DIAGNOSIS — S72142A Displaced intertrochanteric fracture of left femur, initial encounter for closed fracture: Secondary | ICD-10-CM | POA: Diagnosis not present

## 2020-12-11 MED ORDER — LORAZEPAM 2 MG/ML IJ SOLN
1.0000 mg | Freq: Four times a day (QID) | INTRAMUSCULAR | Status: DC | PRN
  Administered 2020-12-11 – 2020-12-12 (×3): 2 mg via INTRAVENOUS
  Filled 2020-12-11 (×3): qty 1

## 2020-12-11 MED ORDER — OLANZAPINE 5 MG PO TBDP
5.0000 mg | ORAL_TABLET | Freq: Every day | ORAL | Status: DC
  Filled 2020-12-11: qty 1

## 2020-12-11 NOTE — Progress Notes (Signed)
Physical Therapy Treatment Patient Details Name: Tom Robertson MRN: [id number] DOB: [birth year] Today's Date: 12/11/2020    History of Present Illness Tom Robertson is a 86 y.o. male with medical history significant of hypertension who presents with hip pain following a fall from standing height sustaining a L intertrochanteric femur fx. Patient s/p cephalomedullary L femur fx 12/23. Increased agitation AM 12/31 and psychiatry consulted.    PT Comments    Pt supine on arrival, drowsy/sleeping, son present in room and receptive to instruction. Pt remained lethargic/drowsy throughout session, per son report/chart review pt had been agitated in AM and previous evening, now had been sleeping ~3 hours and with very limited participation in session due to excessive sleepiness. Reviewed pressure offloading strategies with pt son and HEP handout given, encouraged gentle P/AAROM to tolerance once pt more alert. Pt total A for repositioning in bed to semi-sidelying to offload sacrum and for supine to long sit transfer. Pt with poor seated balance due to lethargy. Pt continues to benefit from skilled rehab in a post acute setting to maximize functional gains before returning home.  Follow Up Recommendations  SNF;Supervision/Assistance - 24 hour (may benefit from memory care setting)     Equipment Recommendations  Other (comment) (defer to post acute rehab)    Recommendations for Other Services       Precautions / Restrictions Precautions Precautions: Fall Precaution Comments: at times confused, agitated/kicking at staff/family Restrictions Weight Bearing Restrictions: Yes LLE Weight Bearing: Weight bearing as tolerated    Mobility  Bed Mobility Overal bed mobility: Needs Assistance Bed Mobility: Supine to Sit;Rolling Rolling: Total assist;+2 for physical assistance   Supine to sit: HOB elevated;Total assist;+2 for physical assistance;+2 for safety/equipment     General bed mobility  comments: Total A x 2 for for all bed mobility (rolling for pressure offloading) due to lethargy and pt noted to push back from manual assist at times. Attempted to have pt perform long sit in bed to assess seated balance and improve alertness however pt pushing torso back into bed/resisting attempt and maintaining eyes tightly shut  Transfers                 General transfer comment: deferred due to lethargy  Ambulation/Gait                 Stairs             Wheelchair Mobility    Modified Rankin (Stroke Patients Only)       Balance Overall balance assessment: Needs assistance;History of Falls Sitting-balance support: Bilateral upper extremity supported;Feet supported Sitting balance-Leahy Scale: Zero Sitting balance - Comments: attempted long sit however pt needs total A for support and actively resisting transfer                                    Cognition Arousal/Alertness: Lethargic;Suspect due to medications Behavior During Therapy: Flat affect Overall Cognitive Status: Impaired/Different from baseline Area of Impairment: Orientation;Memory;Following commands;Awareness;Problem solving;Attention;Safety/judgement                 Orientation Level: Disoriented to;Place;Time;Situation Current Attention Level: Sustained;Focused Memory: Decreased short-term memory Following Commands: Follows one step commands inconsistently Safety/Judgement: Decreased awareness of safety;Decreased awareness of deficits Awareness: Intellectual Problem Solving: Difficulty sequencing;Requires verbal cues;Requires tactile cues;Decreased initiation General Comments: pt too agitated in AM to attempt session per chart review (kicking at family/staff), in PM attempt pt  lethargic/sleeping through most of session; mostly focus on family instruction on HEP/AAROM and pressure offloading techniques/frequency      Exercises General Exercises - Lower  Extremity Ankle Circles/Pumps: PROM;Both;10 reps;Supine Long Arc Quad: PROM;Both;5 reps;Seated (bed in chair position)    General Comments General comments (skin integrity, edema, etc.): son present during session, reviewed pressure offloading techniques and pt son given HEP handout and pressure offloading info (link:[email or web address] Access [email or web address]: [email or web address])      Pertinent Vitals/Pain Pain Assessment: Faces Faces Pain Scale: Hurts a little bit Pain Location: L hip with movement Pain Descriptors / Indicators: Grimacing;Guarding Pain Intervention(s): Repositioned;Limited activity within patient's tolerance    Home Living                      Prior Function            PT Goals (current goals can now be found in the care plan section) Acute Rehab PT Goals Patient Stated Goal: did not state PT Goal Formulation: With patient/family Time For Goal Achievement: 12/18/20 Potential to Achieve Goals: Fair Progress towards PT goals: Not progressing toward goals - comment (limited participation/lethargy due to cognitive status)    Frequency    Min 3X/week      PT Plan Current plan remains appropriate    Co-evaluation              AM-PAC PT "6 Clicks" Mobility   Outcome Measure  Help needed turning from your back to your side while in a flat bed without using bedrails?: Total Help needed moving from lying on your back to sitting on the side of a flat bed without using bedrails?: Total Help needed moving to and from a bed to a chair (including a wheelchair)?: Total Help needed standing up from a chair using your arms (e.g., wheelchair or bedside chair)?: Total Help needed to walk in hospital room?: Total Help needed climbing 3-5 steps with a railing? : Total 6 Click Score: 6    End of Session   Activity Tolerance: Patient limited by lethargy Patient left: in bed;with call bell/phone within reach;with bed alarm set;with family/visitor present Nurse  Communication: Mobility status PT Visit Diagnosis: Unsteadiness on feet (R26.81);Other abnormalities of gait and mobility (R26.89);Muscle weakness (generalized) (M62.81);History of falling (Z91.81)     Time: 1610-9604 PT Time Calculation (min) (ACUTE ONLY): 10 min  Charges:  $Therapeutic Activity: 8-22 mins                     Tom Robertson P., PTA Acute Rehabilitation Services Pager: [phone] Office: [phone]   Dorathy Kinsman Dionne Rossa 12/11/2020, 5:00 PM

## 2020-12-11 NOTE — Consult Note (Addendum)
[facility] Face-to-Face Psychiatry Consult   Reason for Consult: Combative behavior Referring Physician:  Dr Lourdes Sledge Patient Identification: Tom Robertson MRN:  [id number] Principal Diagnosis: Closed comminuted intertrochanteric fracture of left femur [facility]) Diagnosis:  Principal Problem:   Closed comminuted intertrochanteric fracture of left femur [facility]) Active Problems:   Fall at home, initial encounter   HTN (hypertension)   Osteoporosis   Total Time spent with patient: 45 minutes  Subjective:   Tom Robertson is a 86 y.o. male patient admitted with fall and required surgical intervention for his fracture of left humerus  HPI: Patient seen chart reviewed.  I also spoke to his daughter-in-law weekly he was sitting next to him.  Patient apparently has a rough night with irritability, combative behavior, ripping IV lines".  As per daughter-in-law there is no past psychiatric history.  He has never been admitted in the psych hospital and no history of suicidal attempt, psychosis or any hallucination.  However daughter-in-law noticed gradual decline in his memory and confusion.  He is a resident at [facility] facility.  Patient appears more calmer as given recently olanzapine 5 mg and he was also given pain medication.  He is a still confused and incoherent but not combative.  He is able to open his eyes and raise his hand on command but remain confused.  He do not remember where he is in the hospital.  He was lying on his bed naked.  I also spoke to the nursing staff who endorsed that patient is much calmer after given the medication but earlier he was very combative and required more than 1 staff member to calm him down.  I reviewed blood work results.  His BUN is 24 and creatinine 1.11.  His AST and ALT is normal.  Past Psychiatric History: As per family member no history of past psychiatric history.  Risk to Self:  Yes, when is confused and combative. Risk to Others:  Yes, when he is confused and  combative. Prior Inpatient Therapy:   Prior Outpatient Therapy:    Past Medical History:  Past Medical History:  Diagnosis Date  . HTN (hypertension)     Past Surgical History:  Procedure Laterality Date  . INTRAMEDULLARY (IM) NAIL INTERTROCHANTERIC Left 11/17/2020   Procedure: INTRAMEDULLARY (IM) NAIL INTERTROCHANTRIC;  Surgeon: Roby Lofts, MD;  Location: [facility] OR;  Service: Orthopedics;  Laterality: Left;   Family History: History reviewed. No pertinent family history. Family Psychiatric  History: As per family member no history of family psychiatric history. Social History:  Social History   Substance and Sexual Activity  Alcohol Use Yes   Comment: Rarely     Social History   Substance and Sexual Activity  Drug Use Never    Social History   Socioeconomic History  . Marital status: Married    Spouse name: Not on file  . Number of children: Not on file  . Years of education: Not on file  . Highest education level: Not on file  Occupational History  . Not on file  Tobacco Use  . Smoking status: Never Smoker  . Smokeless tobacco: Never Used  Substance and Sexual Activity  . Alcohol use: Yes    Comment: Rarely  . Drug use: Never  . Sexual activity: Not on file  Other Topics Concern  . Not on file  Social History Narrative  . Not on file   Social Determinants of Health   Financial Resource Strain: Not on file  Food Insecurity: Not on  file  Transportation Needs: Not on file  Physical Activity: Not on file  Stress: Not on file  Social Connections: Not on file   Additional Social History:  Lives in assisted living facility.  Wife died more than a year ago.  Allergies:  No Known Allergies  Labs:  Results for orders placed or performed during the hospital encounter of 12/28/2020 (from the past 48 hour(s))  CBC with Differential/Platelet     Status: Abnormal   Collection Time: 12/10/20  1:29 AM  Result Value Ref Range   WBC 9.7 4.0 - 10.5 K/uL   RBC 3.36  (L) 4.22 - 5.81 MIL/uL   Hemoglobin 11.0 (L) 13.0 - 17.0 g/dL   HCT 16.1 (L) 09.6 - 04.5 %   MCV 99.1 80.0 - 100.0 fL   MCH 32.7 26.0 - 34.0 pg   MCHC 33.0 30.0 - 36.0 g/dL   RDW 40.9 81.1 - 91.4 %   Platelets 258 150 - 400 K/uL   nRBC 0.0 0.0 - 0.2 %   Neutrophils Relative % 84 %   Neutro Abs 8.2 (H) 1.7 - 7.7 K/uL   Lymphocytes Relative 6 %   Lymphs Abs 0.5 (L) 0.7 - 4.0 K/uL   Monocytes Relative 8 %   Monocytes Absolute 0.8 0.1 - 1.0 K/uL   Eosinophils Relative 1 %   Eosinophils Absolute 0.1 0.0 - 0.5 K/uL   Basophils Relative 0 %   Basophils Absolute 0.0 0.0 - 0.1 K/uL   Immature Granulocytes 1 %   Abs Immature Granulocytes 0.07 0.00 - 0.07 K/uL    Comment: Performed at [facility], [address], [place], [place] [address]  Comprehensive metabolic panel     Status: Abnormal   Collection Time: 12/10/20  1:29 AM  Result Value Ref Range   Sodium 142 135 - 145 mmol/L   Potassium 4.0 3.5 - 5.1 mmol/L   Chloride 104 98 - 111 mmol/L   CO2 27 22 - 32 mmol/L   Glucose, Bld 114 (H) 70 - 99 mg/dL    Comment: Glucose reference range applies only to samples taken after fasting for at least 8 hours.   BUN 24 (H) 8 - 23 mg/dL   Creatinine, Ser 6.21 0.61 - 1.24 mg/dL   Calcium 8.4 (L) 8.9 - 10.3 mg/dL   Total Protein 5.7 (L) 6.5 - 8.1 g/dL   Albumin 3.0 (L) 3.5 - 5.0 g/dL   AST 32 15 - 41 U/L   ALT 16 0 - 44 U/L   Alkaline Phosphatase 82 38 - 126 U/L   Total Bilirubin 1.8 (H) 0.3 - 1.2 mg/dL   GFR, Estimated >30 >86 mL/min    Comment: (NOTE) Calculated using the CKD-EPI Creatinine Equation (2021)    Anion gap 11 5 - 15    Comment: Performed at [facility], [address], [place], [place] [address]  Magnesium     Status: None   Collection Time: 12/10/20  1:29 AM  Result Value Ref Range   Magnesium 2.1 1.7 - 2.4 mg/dL    Comment: Performed at [facility], [address], [place], [place] [address]  Phosphorus     Status: None   Collection Time: 12/10/20   1:29 AM  Result Value Ref Range   Phosphorus 3.9 2.5 - 4.6 mg/dL    Comment: Performed at [facility], [address], [place], [place] [address]    Current Facility-Administered Medications  Medication Dose Route  Frequency Provider Last Rate Last Admin  . acetaminophen (TYLENOL) tablet 650 mg  650 mg Oral Q6H PRN Alwyn RenMathews, Elizabeth G, MD   650 mg at 12/10/20 0959  . enoxaparin (LOVENOX) injection 30 mg  30 mg Subcutaneous Q24H Ilda BassetDurham, Jennifer D, [place]   30 mg at 12/10/20 [id number]  . hydrALAZINE (APRESOLINE) injection 5 mg  5 mg Intravenous Q6H PRN Alwyn RenMathews, Elizabeth G, MD   5 mg at 12/09/20 1642  . HYDROcodone-acetaminophen (NORCO/VICODIN) 5-325 MG per tablet 1 tablet  1 tablet Oral Q6H PRN Despina HiddenYacobi, Sarah A, PA-C   1 tablet at 12/11/20 0848  . HYDROmorphone (DILAUDID) injection 0.5 mg  0.5 mg Intravenous Q4H PRN Ulyses SouthwardYacobi, Sarah A, PA-C   0.5 mg at 12/11/20 1008  . hydrOXYzine (ATARAX/VISTARIL) tablet 25 mg  25 mg Oral TID PRN Marguerita MerlesSheikh, Omair Latif, DO   25 mg at 12/11/20 0847  . lisinopril (ZESTRIL) tablet 10 mg  10 mg Oral Daily Alwyn RenMathews, Elizabeth G, MD   10 mg at 12/11/20 0847  . metoprolol tartrate (LOPRESSOR) tablet 12.5 mg  12.5 mg Oral BID Alwyn RenMathews, Elizabeth G, MD   12.5 mg at 12/11/20 0848  . [START ON 12/12/2020] OLANZapine zydis (ZYPREXA) disintegrating tablet 5 mg  5 mg Oral Daily Sheikh, Omair Latif, DO      . ondansetron [facility](ZOFRAN) injection 4 mg  4 mg Intravenous Q6H PRN Alwyn RenMathews, Elizabeth G, MD      . polyethylene glycol (MIRALAX / GLYCOLAX) packet 17 g  17 g Oral Daily PRN Ulyses SouthwardYacobi, Sarah A, PA-C      . polyethylene glycol (MIRALAX / GLYCOLAX) packet 17 g  17 g Oral Daily Alwyn RenMathews, Elizabeth G, MD   17 g at 12/10/20 [id number]    Musculoskeletal: Strength & Muscle Tone: decreased Gait & Station: Unable to assess as patient is lying on the bed Patient leans: N/A  Psychiatric Specialty Exam: Physical Exam  Review of Systems  Blood pressure (!) 151/84, pulse 77, temperature 97.8 F (36.6  C), temperature source Oral, resp. rate 17, height 5\' 9"  (1.753 m), weight 61.9 kg, SpO2 96 %.Body mass index is 20.15 kg/m.  General Appearance: Patient is naked and had multiple old bruises.  He is somewhat confused and sleepy.  Eye Contact:  Minimal  Speech:  Incoherent  Volume:  Decreased  Mood:  Irritable  Affect:  Inappropriate and Labile  Thought Process:  Disorganized  Orientation:  Full (Time, Place, and Person)  Thought Content:  Illogical  Suicidal Thoughts:  No  Homicidal Thoughts:  No  Memory:  Immediate;   Poor Recent;   Poor Remote;   Poor  Judgement:  Impaired  Insight:  Lacking  Psychomotor Activity:  Restlessness  Concentration:  Concentration: Poor and Attention Span: Poor  Recall:  Poor  Fund of Knowledge:  Fair  Language:  Fair  Akathisia:  No  Handed:  Right  AIMS (if indicated):     Assets:  Housing Social Support  ADL's:  Impaired  Cognition:  Impaired,  Moderate  Sleep:        Treatment Plan Summary: Plan Delirium  Patient was given olanzapine 5 mg earlier along with pain medication and that calm him down.  Recommend to keep olanzapine 5 mg standing dose once a day and use Ativan 1 to 2 mg IM/IV every 6 as needed for severe agitation.  May need to taper down olanzapine once patient shows improvement and his delirium gets better.  I discussed with her daughter-in-law about antipsychotic medication side  effects, black box warning.  Please call psychiatry if needed in the future.   Disposition: Patient does not meet criteria for psychiatric inpatient admission.  Cleotis Nipper, MD 12/11/2020 11:44 AM

## 2020-12-11 NOTE — Plan of Care (Signed)
  Problem: Clinical Measurements: Goal: Will remain free from infection Outcome: Progressing Goal: Cardiovascular complication will be avoided Outcome: Progressing   Problem: Education: Goal: Knowledge of General Education information will improve Description: Including pain rating scale, medication(s)/side effects and non-pharmacologic comfort measures Outcome: Not Progressing   Problem: Health Behavior/Discharge Planning: Goal: Ability to manage health-related needs will improve Outcome: Not Progressing   

## 2020-12-11 NOTE — Progress Notes (Signed)
12/31 0200 Pt refused to be touched. Pt denies pain. Pt observed to be comfortable. RN will continue to monitor pt.

## 2020-12-11 NOTE — Plan of Care (Signed)

## 2020-12-11 NOTE — Progress Notes (Signed)
PROGRESS NOTE    Tom Robertson  JOA:[id number] DOB: 18-Mar-1926 DOA: 2021-01-01 PCP: Joycelyn Man, NP   Brief Narrative:  HPI per Dr. Beola Cord on 01-Jan-2021 Tom Marus Milleris a 86 y.o.malewith medical history significant ofhypertensionwho presents with hip pain following a fall from standing height. Patient was reaching to pick up a newspaper from the ground and fell on his left side. He reports severe left hip pain which is worse with any movement. Pain is 10 out of 10 at its worst but is improved to moderate pain now.He is been unable to walk since the fall. He does walk with a cane at baseline. He denies any head trauma or taking any anticoagulation. He has 2 sons who the EDP spoke with who confirmed that he is on minimal medications only antihypertensive, aspirin and vitamins. He denies fever, cough, chest pain, shortness of breath, abdominal pain, constipation, diarrhea, nausea.  ED Course:Vitals in ED significant for intermittent hypertension in the 140s to 190s systolic. Some readings of low heart rate in the upper 50s. Lab work-up showed BMP that was normal, LFTs with calcium of 8.6which corrects considering albumin of 3.1, protein of 5.4. CBC within normal notes. Respiratory panel for flu and Covid pending. Patient was given pain medication and and orthopedics was consulted for his fracture. Chest x-ray was without acute disease. DG pelvis and DG left femur showed left intratrochanteric hip fracture. CT head was without acute process, but incidental hyperdense region noticed which may need follow-up MRI. CT C-spine showed multilevel spondylosis but no acute fracture. CT abdomen redemonstrated left femoral fracture otherwise showed no acute abdominal or pelvic process diverticulosis without diverticulitis noted  **Interim History Patient was taken for surgical intervention for his intertrochanteric fracture of the left femur the setting of his  acute fall on 11/18/2020.  PT OT will need to evaluate the patient after surgical intervention to determine disposition and recommending SNF.  He was deemed medically stable to be discharged 12/08/20 but overnight became extremely confused and delirious.  He was very agitated on morning of 12/09/20 and given Zyprexa.  We have placed him on delirium precautions if he continues to be confused will work-up further with imaging currently feels that this is related to acute delirium.  Patient appeared to be calmer yesterday however this morning he had been calm combative and agitated again and had a rough night.  He is calmer after the olanzapine was given and because of his continued behavioral issues psychiatry was consulted for further evaluation and they recommended continue olanzapine and tapering down once his delirium resolves.  They have also recommended lorazepam 1 to 2 mg for severe agitation.  Assessment & Plan:   Principal Problem:   Closed comminuted intertrochanteric fracture of left femur (HCC) Active Problems:   Fall at home, initial encounter   HTN (hypertension)   Osteoporosis  Acute Fall Osteoporosis Intertrochanteric fracture of the left femur in the setting of Fall status post cephalomedullary nailing on 12/04/2019 -Patient had a fall from standing height while reaching to pick a newspaper up off the ground. No preceding symptoms no loss of consciousness typically walks with a cane. -Did not hit his head, not on anticoagulation -Orthopedic surgery evaluated and patient was taken to surgery on 11/15/2020 -With fracture from fall from standing height meets definition for osteoporosis. -Appreciate orthopedics recommendations -Pain control initiated and c/w Hydrocodone-Acetaminophen 1 tab po q6hprn Moderate Pain, and Hydromorphone 0.5 mg IV q4hprn Severe Pain; Given IV Morphine 2 mg in  the ED -C/w Miralax 17 grams po daily as well as Dailyprn -Hip fracture precautions -Takes Aspirin  81 mg po Daily at Home. Will hold for surgery  -IVF has now stopped  -AM labs repeated, check vitamin D level -Further Care per Orthopedic Surgery and Dr. Jena Gauss evaluated and recommended that the patient will require a cephalomedullary nailing of his left hip fracture.  Risks and benefits were discussed and he underwent surgical intervention on 12/11/2020 afternoon. -Orthopedic surgery recommending weightbearing as tolerated on the left lower extremity and maintaining his Mepilex dressings and recommending Lovenox and SCDs as well as ambulation for VT prophylaxis as well as enoxaparin 30 mg subcu every 12 hours -PT OT recommending skilled nursing facility and stable to D/C -At baseline he walks with a cane -Patient will need to follow-up with Dr. Jena Gauss at discharge in 2 weeks  Acute Delirium with significant confusion and agitation -Overnight on 12/28-12/29 he tried get out of bed and started having loud outbursts.  He was given 1 mg of IM Ativan the night before last and security was called to help calm him down  -Bladder scan to assess if the patient is retaining urine and he was Retaining; Given an I and O Cath; Repeat Bladder Scan today  -Labs are within normal limits and he is not hyponatremic hypokalemia and renal function is improved  -Given p.o. Zyprexa 5 mg sublingually and will continue daily and taper down when Delirium improves -continue to reorient frequently and continue with delirium precautions -Have family sitter at bedside and will continue  -Continue with bowel regimen and pain control -Not in restraints -Consulted Psychiatry Dr. Kathryne Sharper and recommending continuing current plan of care and if necessary then give 1-2 mg of IV/IM Lorazepam for Severe Agitation -If he is not improving may work-up further with head imaging and an EEG but will hold off as he is much improved today   Hypertension -Intermittently hypertensive in ED; Last blood Pressure was  -Patient takes  Lisinopril 20 mg po Daily at home and was held initially but resumed at 10 mg p.o. daily -Continue metoprolol tartrate 12.5 milligrams p.o. twice daily -If needed can add on IV Hydralazine -Continue to monitor blood pressures per protocol and last blood pressure was 151/84  Hyperglycemia in the setting of prediabetes -Patient's Blood Sugar has been elevated on Daily BMP/CMP; on Admission was 101 and repeat was 136  -Check HbA1c and was 5.7 -Continue to Monitor Blood Sugars carefully and if Necessary will place on Sensitive Novolog SSI AC -Patient's blood sugars have been ranging from 98-141 daily on BMP/CMP; Blood Sugar was 114 on last check   Dysphagia/Concern for Aspiration -Nursing had noted that he was gagging coughing up with an RN try to give him fluids and he spit out his medications and did not suck from straw -SLP evaluated and recommended MBS -MBS done and they recommended dysphagia 2 diet with thin liquids and no straws  Thrombocytopenia -Mild as Platelet Count went from 154 -> 145 and is now improved further and is now normal at 183 yesterday  -Continue to Monitor for S/Sx of Bleeding; Currently no overt bleeding noted -Repeat CBC in the AM   Hyperbilirubinemia -Patient's T bili was 2.1 is now improved to 1.5 yesterday and yesterday was 1.8 -Likely reactive -Patient's direct bilirubin is 0.4 and indirect was 1.7 on 12/08/2020 -Continue to Monitor trend and repeat CMP in a.m.  AKI on likely CKD stage IIIa -Patient BUNs/creatinine trended up to 25/1.71 -Improved  with IV fluid hydration is now trended down to 24/1.11 yesterday; IV fluid hydration is now stopped -Continue monitor and trend and avoid nephrotoxic medications, contrast dyes, hypotension and renally dose medications -Repeat CMP in a.m.  Macrocytic Anemia -Postoperatively patient's hemoglobin/hematocrit remained stable on last check of 11.0/32.3 and MCV is gone from 100.3 and is now 99.1 -Check anemia panel  and showed an iron level of 45, U IBC of 190, TIBC of 235, saturation ratios of 19%, ferritin level 117, folate level 29.2, and vitamin B12 level is 157 -Continue to monitor for signs and symptoms of bleeding; currently no overt bleeding noted -Repeat CBC in a.m.  HLD -Takes Atorvastatin 20 mg po Daily and will resume  CT findings -CT head was without acute process, but incidental 1.7 cm hyperdense region noticed in the Left Frontal White matter that could reflect an area of dysplasia or vascular malformation -Follow-up nonemergent MRI with and without contrast recommended to better characterize and exclude underlying mass lesion  -Can be done after Hip procedure or in the outpatient setting  -Dr. Ashley Royalty spoke with the patient's son [name] and they did not want to pursue further work-up on this  GOC: DNR, poA  DVT prophylaxis: Enoxaparin 30 mg subcu every 24 Code Status: DO NOT RESUSCITATE  Family Communication: Discussed with Daughter in Law at bedside  Disposition Plan: SNF when bed is available as he is medically stable to be discharged now that his delirium has improved significantly  Status is: Inpatient  Remains inpatient appropriate because:Unsafe d/c plan, IV treatments appropriate due to intensity of illness or inability to take PO and Inpatient level of care appropriate due to severity of illness   Dispo: The patient is from: Home              Anticipated d/c is to: SNF              Anticipated d/c date is: 1-2 days              Patient currently is medically stable to d/c.   Consultants:   Orthopedic Surgery   Procedures:  CPT 27245-Cephalomedullary nailing of left intertrochanteric femur fracture done by Dr. Jena Gauss on 11/27/2020  Antimicrobials:  Anti-infectives (From admission, onward)   Start     Dose/Rate Route Frequency Ordered Stop   12/04/2020 2200  ceFAZolin (ANCEF) IVPB 2g/100 mL premix        2 g 200 mL/hr over 30 Minutes Intravenous Every 8 hours  11/13/2020 1647 12/04/20 1530   11/21/2020 1450  vancomycin (VANCOCIN) powder  Status:  Discontinued          As needed 11/18/2020 1451 11/16/2020 1543   11/18/2020 1402  ceFAZolin (ANCEF) 2-4 GM/100ML-% IVPB       Note to Pharmacy: Aquilla Hacker   : cabinet override      12/07/2020 1402 12/04/20 0214        Subjective: Seen and examined at bedside and he was extremely agitated and combative earlier this morning but when I walked in he was calmer and holding his daughter-in-law's hand.  He had pulled out his IV this morning and taken off all his pills.  He is laying still and calmer.  Nursing reported earlier that he is kicking his daughter-in-law.  He is in no acute distress and psychiatry was consulted for his delirium and recommended continue current plan of care and providing IM/IV lorazepam if necessary for severe agitation every 6 hours.  SNF bed is still pending.  We'll continue to monitor discharge once insurance authorization is obtained.   Objective: Vitals:   12/10/20 1315 12/10/20 2050 12/11/20 0609 12/11/20 0732  BP: (!) 136/96 121/71 (!) 152/98 (!) 151/84  Pulse: 70 91 84 77  Resp: 16 18 18 17   Temp: 97.6 F (36.4 C) 97.8 F (36.6 C) 97.7 F (36.5 C) 97.8 F (36.6 C)  TempSrc: Oral  Oral Oral  SpO2: 98% 93% 97% 96%  Weight:      Height:        Intake/Output Summary (Last 24 hours) at 12/11/2020 1248 Last data filed at 12/10/2020 1700 Gross per 24 hour  Intake --  Output 300 ml  Net -300 ml   Filed Weights   12/04/20 1600  Weight: 61.9 kg   Examination: Physical Exam:  Constitutional: The patient is a thin elderly Caucasian male who is a little bit more agitated today but he is laying calm when I walked in the room and he is holding his daughter-in-law's hand.  He has pulled off all his clothes and trying to rip off his IV.  Had a very rough night per the daughter.  Does not appear to be in any acute distress currently Eyes: Lids and conjunctivae normal, sclerae  anicteric  ENMT: External Ears, Nose appear normal. Grossly normal hearing.  Neck: Appears normal, supple, no cervical masses, normal ROM, no appreciable thyromegaly; no JVD Respiratory: Clear to auscultation bilaterally, no wheezing, rales, rhonchi or crackles. Normal respiratory effort and patient is not tachypenic. No accessory muscle use.  Unlabored breathing Cardiovascular: RRR, no murmurs / rubs / gallops. S1 and S2 auscultated.  No appreciable extremity edema noted Abdomen: Soft, non-tender, non-distended. Bowel sounds positive.  GU: Deferred. Musculoskeletal: No clubbing / cyanosis of digits/nails. No joint deformity upper and lower extremities. Skin: Left hip incisions appear clean dry and intact.  He has significant bruising and ecchymosis on the left side of his hip and leg. No induration; Warm and dry.  Neurologic: CN 2-12 grossly intact with no focal deficits. Romberg sign and cerebellar reflexes not assessed.  Psychiatric: Slightly impaired judgment and insight.  He is awake and alert but not fully oriented x 3. Normal mood and appropriate affect.   Data Reviewed: I have personally reviewed following labs and imaging studies  CBC: Recent Labs  Lab 12/05/20 0221 12/07/20 0803 12/08/20 0918 12/09/20 0206 12/10/20 0129  WBC 12.5* 7.9 8.6 6.2 9.7  NEUTROABS 10.7*  --  6.8 4.8 8.2*  HGB 10.0* 9.2* 10.7* 9.9* 11.0*  HCT 30.3* 27.6* 33.6* 31.1* 33.3*  MCV 97.4 98.9 100.3* 100.3* 99.1  PLT 134* 162 211 183 258   Basic Metabolic Panel: Recent Labs  Lab 12/05/20 0221 12/07/20 0803 12/08/20 0254 12/08/20 0918 12/09/20 0206 12/10/20 0129  NA 140 141 143  --  141 142  K 4.3 3.6 3.5  --  3.6 4.0  CL 102 104 103  --  105 104  CO2 25 26 27   --  27 27  GLUCOSE 116* 102* 98  --  99 114*  BUN 36* 27* 26*  --  23 24*  CREATININE 1.63* 1.12 1.19  --  1.06 1.11  CALCIUM 8.4* 8.5* 8.6*  --  8.0* 8.4*  MG  --   --   --  2.0 1.9 2.1  PHOS  --   --   --  3.1 3.4 3.9    GFR: Estimated Creatinine Clearance: 37.2 mL/min (by C-G formula based on SCr of 1.11 mg/dL). Liver  Function Tests: Recent Labs  Lab 12/05/20 0221 12/07/20 0803 12/08/20 0918 12/09/20 0206 12/10/20 0129  AST 86* 50* 44* 31 32  ALT 13 14 16 14 16   ALKPHOS 69 67 81 71 82  BILITOT 1.0 1.5* 2.1* 1.5* 1.8*  PROT 5.4* 5.2* 6.0* 5.2* 5.7*  ALBUMIN 3.0* 2.6* 3.1* 2.7* 3.0*   No results for input(s): LIPASE, AMYLASE in the last 168 hours. No results for input(s): AMMONIA in the last 168 hours. Coagulation Profile: No results for input(s): INR, PROTIME in the last 168 hours. Cardiac Enzymes: No results for input(s): CKTOTAL, CKMB, CKMBINDEX, TROPONINI in the last 168 hours. BNP (last 3 results) No results for input(s): PROBNP in the last 8760 hours. HbA1C: No results for input(s): HGBA1C in the last 72 hours. CBG: No results for input(s): GLUCAP in the last 168 hours. Lipid Profile: No results for input(s): CHOL, HDL, LDLCALC, TRIG, CHOLHDL, LDLDIRECT in the last 72 hours. Thyroid Function Tests: No results for input(s): TSH, T4TOTAL, FREET4, T3FREE, THYROIDAB in the last 72 hours. Anemia Panel: Recent Labs    12/09/20 0206  VITAMINB12 157*  FOLATE 29.2  FERRITIN 117  TIBC 235*  IRON 45  RETICCTPCT 3.1   Sepsis Labs: No results for input(s): PROCALCITON, LATICACIDVEN in the last 168 hours.  Recent Results (from the past 240 hour(s))  Resp Panel by RT-PCR (Flu A&B, Covid) Nasopharyngeal Swab     Status: None   Collection Time: 2020/12/06  3:52 PM   Specimen: Nasopharyngeal Swab; Nasopharyngeal(NP) swabs in vial transport medium  Result Value Ref Range Status   SARS Coronavirus 2 by RT PCR NEGATIVE NEGATIVE Final    Comment: (NOTE) SARS-CoV-2 target nucleic acids are NOT DETECTED.  The SARS-CoV-2 RNA is generally detectable in upper respiratory specimens during the acute phase of infection. The lowest concentration of SARS-CoV-2 viral copies this assay can detect  is 138 copies/mL. A negative result does not preclude SARS-Cov-2 infection and should not be used as the sole basis for treatment or other patient management decisions. A negative result may occur with  improper specimen collection/handling, submission of specimen other than nasopharyngeal swab, presence of viral mutation(s) within the areas targeted by this assay, and inadequate number of viral copies(<138 copies/mL). A negative result must be combined with clinical observations, patient history, and epidemiological information. The expected result is Negative.  Fact Sheet for Patients:  12/04/20  Fact Sheet for Healthcare Providers:  BloggerCourse.com  This test is no t yet approved or cleared by the [employer] FDA and  has been authorized for detection and/or diagnosis of SARS-CoV-2 by FDA under an Emergency Use Authorization (EUA). This EUA will remain  in effect (meaning this test can be used) for the duration of the COVID-19 declaration under Section 564(b)(1) of the Act, 21 U.S.C.section 360bbb-3(b)(1), unless the authorization is terminated  or revoked sooner.       Influenza A by PCR NEGATIVE NEGATIVE Final   Influenza B by PCR NEGATIVE NEGATIVE Final    Comment: (NOTE) The Xpert Xpress SARS-CoV-2/FLU/RSV plus assay is intended as an aid in the diagnosis of influenza from Nasopharyngeal swab specimens and should not be used as a sole basis for treatment. Nasal washings and aspirates are unacceptable for Xpert Xpress SARS-CoV-2/FLU/RSV testing.  Fact Sheet for Patients: [place]  Fact Sheet for Healthcare Providers: BloggerCourse.com  This test is not yet approved or cleared by the [employer] FDA and has been authorized for detection and/or diagnosis of SARS-CoV-2 by FDA under an  Emergency Use Authorization (EUA). This EUA will remain in effect  (meaning this test can be used) for the duration of the COVID-19 declaration under Section 564(b)(1) of the Act, 21 U.S.C. section 360bbb-3(b)(1), unless the authorization is terminated or revoked.  Performed at [facility], [address], [place], [place] [address]      RN Pressure Injury Documentation:     Estimated body mass index is 20.15 kg/m as calculated from the following:   Height as of this encounter:  (1.753 m).   Weight as of this encounter: 61.9 kg.  Malnutrition Type:   Malnutrition Characteristics:   Nutrition Interventions:     Radiology Studies: No results found. Scheduled Meds: . enoxaparin (LOVENOX) injection  30 mg Subcutaneous Q24H  . lisinopril  10 mg Oral Daily  . metoprolol tartrate  12.5 mg Oral BID  . [START ON 12/12/2020] OLANZapine zydis  5 mg Oral Daily  . polyethylene glycol  17 g Oral Daily   Continuous Infusions:   LOS: 9 days   Merlene Laughter, DO [facility] PAGER is on AMION  If 7PM-7AM, please contact night-coverage [email or web address]

## 2020-12-12 ENCOUNTER — Encounter (HOSPITAL_COMMUNITY): Payer: Self-pay | Admitting: Internal Medicine

## 2020-12-12 ENCOUNTER — Inpatient Hospital Stay (HOSPITAL_COMMUNITY): Payer: Medicare HMO

## 2020-12-12 DIAGNOSIS — S72142A Displaced intertrochanteric fracture of left femur, initial encounter for closed fracture: Secondary | ICD-10-CM | POA: Diagnosis not present

## 2020-12-12 DIAGNOSIS — Z7189 Other specified counseling: Secondary | ICD-10-CM

## 2020-12-12 DIAGNOSIS — W19XXXA Unspecified fall, initial encounter: Secondary | ICD-10-CM | POA: Diagnosis not present

## 2020-12-12 DIAGNOSIS — M810 Age-related osteoporosis without current pathological fracture: Secondary | ICD-10-CM | POA: Diagnosis not present

## 2020-12-12 DIAGNOSIS — Z515 Encounter for palliative care: Secondary | ICD-10-CM | POA: Diagnosis not present

## 2020-12-12 DIAGNOSIS — I10 Essential (primary) hypertension: Secondary | ICD-10-CM | POA: Diagnosis not present

## 2020-12-12 DIAGNOSIS — Z66 Do not resuscitate: Secondary | ICD-10-CM

## 2020-12-12 LAB — COMPREHENSIVE METABOLIC PANEL
ALT: 24 U/L (ref 0–44)
AST: 43 U/L — ABNORMAL HIGH (ref 15–41)
Albumin: 3 g/dL — ABNORMAL LOW (ref 3.5–5.0)
Alkaline Phosphatase: 81 U/L (ref 38–126)
Anion gap: 11 (ref 5–15)
BUN: 31 mg/dL — ABNORMAL HIGH (ref 8–23)
CO2: 27 mmol/L (ref 22–32)
Calcium: 8.8 mg/dL — ABNORMAL LOW (ref 8.9–10.3)
Chloride: 108 mmol/L (ref 98–111)
Creatinine, Ser: 1.26 mg/dL — ABNORMAL HIGH (ref 0.61–1.24)
GFR, Estimated: 54 mL/min — ABNORMAL LOW (ref >60)
Glucose, Bld: 96 mg/dL (ref 70–99)
Potassium: 4.1 mmol/L (ref 3.5–5.1)
Sodium: 146 mmol/L — ABNORMAL HIGH (ref 135–145)
Total Bilirubin: 3.8 mg/dL — ABNORMAL HIGH (ref 0.3–1.2)
Total Protein: 5.8 g/dL — ABNORMAL LOW (ref 6.5–8.1)

## 2020-12-12 LAB — LIPASE, BLOOD: Lipase: 23 U/L (ref 11–51)

## 2020-12-12 LAB — MAGNESIUM: Magnesium: 2 mg/dL (ref 1.7–2.4)

## 2020-12-12 LAB — CBC WITH DIFFERENTIAL/PLATELET
Abs Immature Granulocytes: 0.04 10*3/uL (ref 0.00–0.07)
Basophils Absolute: 0 10*3/uL (ref 0.0–0.1)
Basophils Relative: 0 %
Eosinophils Absolute: 0.1 10*3/uL (ref 0.0–0.5)
Eosinophils Relative: 1 %
HCT: 33.3 % — ABNORMAL LOW (ref 39.0–52.0)
Hemoglobin: 10.9 g/dL — ABNORMAL LOW (ref 13.0–17.0)
Immature Granulocytes: 1 %
Lymphocytes Relative: 6 %
Lymphs Abs: 0.5 10*3/uL — ABNORMAL LOW (ref 0.7–4.0)
MCH: 32.9 pg (ref 26.0–34.0)
MCHC: 32.7 g/dL (ref 30.0–36.0)
MCV: 100.6 fL — ABNORMAL HIGH (ref 80.0–100.0)
Monocytes Absolute: 0.6 10*3/uL (ref 0.1–1.0)
Monocytes Relative: 7 %
Neutro Abs: 6.9 10*3/uL (ref 1.7–7.7)
Neutrophils Relative %: 85 %
Platelets: 259 10*3/uL (ref 150–400)
RBC: 3.31 MIL/uL — ABNORMAL LOW (ref 4.22–5.81)
RDW: 14.4 % (ref 11.5–15.5)
WBC: 8.2 10*3/uL (ref 4.0–10.5)
nRBC: 0 % (ref 0.0–0.2)

## 2020-12-12 LAB — PHOSPHORUS: Phosphorus: 3.1 mg/dL (ref 2.5–4.6)

## 2020-12-12 MED ORDER — POLYVINYL ALCOHOL 1.4 % OP SOLN
1.0000 [drp] | Freq: Four times a day (QID) | OPHTHALMIC | Status: DC | PRN
  Administered 2020-12-13: 1 [drp] via OPHTHALMIC
  Filled 2020-12-12: qty 15

## 2020-12-12 MED ORDER — LORAZEPAM 2 MG/ML IJ SOLN
0.5000 mg | INTRAMUSCULAR | Status: DC | PRN
  Administered 2020-12-13: 1 mg via INTRAVENOUS
  Filled 2020-12-12 (×2): qty 1

## 2020-12-12 MED ORDER — MORPHINE 100MG IN NS 100ML (1MG/ML) PREMIX INFUSION
1.0000 mg/h | INTRAVENOUS | Status: DC
  Administered 2020-12-12: 1 mg/h via INTRAVENOUS
  Filled 2020-12-12: qty 100

## 2020-12-12 MED ORDER — HYPROMELLOSE (GONIOSCOPIC) 2.5 % OP SOLN: 1.0000 [drp] | Freq: Four times a day (QID) | OPHTHALMIC | Status: DC | PRN

## 2020-12-12 MED ORDER — MORPHINE BOLUS VIA INFUSION
2.0000 mg | INTRAVENOUS | Status: DC | PRN
  Administered 2020-12-13 (×2): 2 mg via INTRAVENOUS
  Filled 2020-12-12: qty 2

## 2020-12-12 MED ORDER — GLYCOPYRROLATE 0.2 MG/ML IJ SOLN
0.4000 mg | INTRAMUSCULAR | Status: DC
  Administered 2020-12-12 – 2020-12-13 (×5): 0.4 mg via INTRAVENOUS
  Filled 2020-12-12 (×12): qty 2

## 2020-12-12 MED ORDER — BIOTENE DRY MOUTH MT LIQD: 15.0000 mL | OROMUCOSAL | Status: DC | PRN

## 2020-12-12 MED ORDER — BISACODYL 10 MG RE SUPP: 10.0000 mg | Freq: Every day | RECTAL | Status: DC | PRN

## 2020-12-12 MED ORDER — TRAZODONE HCL 50 MG PO TABS: 100.0000 mg | ORAL_TABLET | Freq: Every day | ORAL | Status: DC

## 2020-12-12 MED ORDER — DEXTROSE 5 % IV SOLN: INTRAVENOUS | Status: DC

## 2020-12-12 MED ORDER — CHLORHEXIDINE GLUCONATE CLOTH 2 % EX PADS: 6.0000 | MEDICATED_PAD | Freq: Every day | CUTANEOUS | Status: DC

## 2020-12-12 MED ORDER — ACETAMINOPHEN 650 MG RE SUPP: 650.0000 mg | Freq: Four times a day (QID) | RECTAL | Status: DC | PRN

## 2020-12-12 NOTE — Progress Notes (Signed)
Palliative care met with the patient and the patient's family and they have decided on comfort measures and they are very clear with her wishes.  We will cancel the patient MRI as well as EEG at this time and discontinue all medications not in the patient's comfort.  We will make a referral for residential hospice and make sure the patient's symptoms are well controlled.

## 2020-12-12 NOTE — Progress Notes (Signed)
Nursing has been in and out of pt's room today, providing oral care swabbing mouth for dryness (mouth breathing) and later for oral/subglottic secretions. Pt fights staff (swings at or grabs at staff with any care provided, pt pulls covers and gown off constantly) pt gets very agitated with any type of care provided. Grandson assisted with pt this am, helping staff to provide care as needed.  Pt's niece here at this time. After discussing pt's care with niece and other family via phone, their wishes for pt is comfort/palliative care. Message sent to Dr Marland Mcalpine expressing family's wishes for pt to be comfort care and asking if he could talk with family.

## 2020-12-12 NOTE — Consult Note (Addendum)
Palliative Medicine Inpatient Consult Note  Reason for consult:  Goals of Care "GOC Discussion; Severe Agitation and Delirium"  HPI:  Per intake H&P --> Tom Dance Milleris a 87 y.o.malewith medical history significant ofhypertensionwho presents with hip pain following a fall from standing height. Patient was reaching to pick up a newspaper from the ground and fell on his left side. He reports severe left hip pain which is worse with any movement. Pain is 10 out of 10 at its worst but is improved to moderate pain now.He is been unable to walk since the fall. He does walk with a cane at baseline. He denies any head trauma or taking any anticoagulation. He has 2 sons who the EDP spoke with who confirmed that he is on minimal medications only antihypertensive, aspirin and vitamins. He denies fever, cough, chest pain, shortness of breath, abdominal pain, constipation, diarrhea, nausea.  Tom Robertson of care was asked to get involved in the setting of unremitting severe delirium.  Psychiatry involved without improvement.  Per discussion with patient's bedside nurse Tom Robertson family is interested in palliative measures.  Clinical Assessment/Goals of Care: I have reviewed medical records including EPIC notes, labs and imaging, received report from bedside RN, assessed the patient who appears to be in the hypoactive phases of delirium.     I met with patient's niece Tom Robertson and called patient's son POA Tom Robertson to further discuss diagnosis prognosis, GOC, EOL wishes, disposition and options.   I introduced Palliative Medicine as specialized medical care for people living with serious illness. It focuses on providing relief from the symptoms and stress of a serious illness. The goal is to improve quality of life for both the patient and the family.  Tom Robertson is originally from [place].  He moved years ago to [place], [place].  He had been married to his wife Tom Robertson for almost 55 years  before her death a year and a half ago.  He had 4 sons.  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  He is a man who enjoyed being outdoors.  He is a man of faith and practices within the [place] religion.  Prior to hospitalization Tom Robertson was mobile at [facility] where he has been living for over a year.  Per Tom Robertson his quality of life had declined quite significantly and he overtime became more isolated.  He has made comments to her on multiple occasions that he is ready to die when God is ready to take him.  We discussed Tom Robertson's hospitalization and the complications associated with acute delirium.  We discussed that overall mortality is increased tremendously after experiencing hip fracture.  We reviewed that we have 1 of 2 options we could either wait and continue treatment for Tom Robertson's complex delirium or alternatively treat his symptoms aggressively and allow him to be made comfortable as he progresses from this life.  Patient's son niece and other children had already had a complex discussion regarding these matters and have universally decided to make Tom Robertson comfortable and allow him to passes peacefully as able.  Concepts specific to code status, artifical feeding and hydration, continued IV antibiotics and rehospitalization was had.    Is a do not attempt resuscitation do not attempt intubation and perform no heroic life-prolonging measures.  The difference between a aggressive medical intervention path  and a palliative comfort care path for this patient at this time was had. We talked about transition to comfort  measures in house and what that would entail inclusive of medications to control pain, dyspnea, agitation, nausea, itching, and hiccups.  We discussed stopping all uneccessary measures such as blood draws, needle sticks, and frequent vital signs. Utilized reflective listening throughout our time together.   Patient's family have elected to  proceed with comfort focused care.  They share that optimization of comfort is amongst their highest concern.  Decision Maker: Tom Robertson (son) [phone]  SUMMARY OF RECOMMENDATIONS   DNAR/DNI  Comfort focused care  Medications per [facility]  Liberalize visitation policy  Transitions of care team consultation for inpatient hospice placement  Code Status/Advance Care Planning: DNAR/DNI   Palliative Prophylaxis:   Oral care, turn every 2 hours, delirium precautions, aspiration precautions  Additional Recommendations (Limitations, Scope, Preferences):  Comfort care   Psycho-social/Spiritual:   Desire for further Chaplaincy support: - Christian  Additional Recommendations:  Education on end-of-life processes   Prognosis:  Poor limited to days  Discharge Planning: Discharge will be to hospice home or [facility]  Vitals:   12/12/20 1029 12/12/20 1447  BP: (!) 157/95 (!) 100/48  Pulse: 93 88  Resp:    Temp:  98.9 F (37.2 C)  SpO2: 91%     Intake/Output Summary (Last 24 hours) at 12/12/2020 1657 Last data filed at 12/12/2020 0256 Gross per 24 hour  Intake -  Output 350 ml  Net -350 ml   Last Weight  Most recent update: 12/04/2020  4:42 PM   Weight  61.9 kg (136 lb 7.4 oz)           Gen: Frail elderly male somnolent  HEENT: moist mucous membranes CV: Regular rate and rhythm PULM: ON RA rhonchi auscultation bilaterally ABD: soft/nontender EXT: Plus edema on left lower extremity Neuro: Somnolent  PPS: 10%   This conversation/these recommendations were discussed with patient primary care team, Dr. Alfredia Ferguson  Time In: 1600 Time Out: 1710 Total Time: 70 Greater than 50%  of this time was spent counseling and coordinating care related to the above assessment and plan.  [facility] Team Team Cell Phone: [phone] Please utilize secure chat with additional questions, if there is no response within 30 minutes please call  the above phone number  Palliative Medicine Team providers are available by phone from 7am to 7pm daily and can be reached through the team cell phone.  Should this patient require assistance outside of these hours, please call the patient's attending physician.

## 2020-12-12 NOTE — Progress Notes (Signed)
PROGRESS NOTE    Tom Robertson  WUJ:[id number] DOB: March 09, 1926 DOA: Jan 06, 2020 PCP: Tom Robertson   Brief Narrative:  HPI per Dr. Beola CordAlexander Robertson on 09-Oct-2020 Tom Robertson a 87 y.o.malewith medical history significant ofhypertensionwho presents with hip pain following a fall from standing height. Patient was reaching to pick up a newspaper from the ground and fell on his left side. He reports severe left hip pain which is worse with any movement. Pain is 10 out of 10 at its worst but is improved to moderate pain now.He is been unable to walk since the fall. He does walk with a cane at baseline. He denies any head trauma or taking any anticoagulation. He has 2 sons who the EDP spoke with who confirmed that he is on minimal medications only antihypertensive, aspirin and vitamins. He denies fever, cough, chest pain, shortness of breath, abdominal pain, constipation, diarrhea, nausea.  ED Course:Vitals in ED significant for intermittent hypertension in the 140s to 190s systolic. Some readings of low heart rate in the upper 50s. Lab work-up showed BMP that was normal, LFTs with calcium of 8.6which corrects considering albumin of 3.1, protein of 5.4. CBC within normal notes. Respiratory panel for flu and Covid pending. Patient was given pain medication and and orthopedics was consulted for his fracture. Chest x-ray was without acute disease. DG pelvis and DG left femur showed left intratrochanteric hip fracture. CT head was without acute process, but incidental hyperdense region noticed which may need follow-up MRI. CT C-spine showed multilevel spondylosis but no acute fracture. CT abdomen redemonstrated left femoral fracture otherwise showed no acute abdominal or pelvic process diverticulosis without diverticulitis noted  **Interim History Patient was taken for surgical intervention for his intertrochanteric fracture of the left femur the setting of his  acute fall on 12/11/2020.  PT OT will need to evaluate the patient after surgical intervention to determine disposition and recommending SNF.  He was deemed medically stable to be discharged 12/08/20 but overnight became extremely confused and delirious.  He was very agitated on morning of 12/09/20 and given Zyprexa.  We have placed him on delirium precautions if he continues to be confused will work-up further with imaging currently feels that this is related to acute delirium.  Patient appeared to be calmer yesterday however this morning he had been calm combative and agitated again and had a rough night.  He is calmer after the olanzapine was given and because of his continued behavioral issues psychiatry was consulted for further evaluation and they recommended continue olanzapine and tapering down once his delirium resolves.  They have also recommended lorazepam 1 to 2 mg for severe agitation.  12/12/2020: Patient continues to be significantly agitated and combative and altered.  He remains encephalopathic and is likely delirious but will need to rule out other etiologies.  He is acutely retaining his urine so will place Foley catheter in for his acute urinary retention.  Will discuss with neurology about further head imaging prior to formal consultation and they recommend obtaining the MRI with and without contrast as well as an EEG and Dr. Otelia LimesLindzen made some recommendations about several medications and we have discontinued his olanzapine as well as his Atarax and started the patient on trazodone 100 g p.o. nightly  Assessment & Plan:   Principal Problem:   Closed comminuted intertrochanteric fracture of left femur (HCC) Active Problems:   Fall at home, initial encounter   HTN (hypertension)   Osteoporosis  Acute Fall Osteoporosis Intertrochanteric  fracture of the left femur in the setting of Fall status post cephalomedullary nailing on 12/04/2019 -Patient had a fall from standing height while  reaching to pick a newspaper up off the ground. No preceding symptoms no loss of consciousness typically walks with a cane. -Did not hit his head, not on anticoagulation -Orthopedic surgery evaluated and patient was taken to surgery on 11/25/2020 -With fracture from fall from standing height meets definition for osteoporosis. -Appreciate orthopedics recommendations -Pain control initiated and c/w Hydrocodone-Acetaminophen 1 tab po q6hprn Moderate Pain, and Hydromorphone 0.5 mg IV q4hprn Severe Pain; Given IV Morphine 2 mg in the ED -C/w Miralax 17 grams po daily as well as Dailyprn -Hip fracture precautions -Takes Aspirin 81 mg po Daily at Home. Will hold for surgery  -IVF has now stopped  -AM labs repeated, check vitamin D level -Further Care per Orthopedic Surgery and Dr. Doreatha Martin evaluated and recommended that the patient will require a cephalomedullary nailing of his left hip fracture.  Risks and benefits were discussed and he underwent surgical intervention on 11/18/2020 afternoon. -Orthopedic surgery recommending weightbearing as tolerated on the left lower extremity and maintaining his Mepilex dressings and recommending Lovenox and SCDs as well as ambulation for VT prophylaxis as well as enoxaparin 30 mg subcu every 12 hours -PT OT recommending skilled nursing facility and stable to D/C -At baseline he walks with a cane -Patient will need to follow-up with Dr. Doreatha Martin at discharge in 2 weeks  Acute Delirium with significant confusion and agitation and acute encephalopathy, had initially improved but now continues to worsen -Overnight on 12/28-12/29 he tried get out of bed and started having loud outbursts.  He was given 1 mg of IM Ativan the night before last and security was called to help calm him down  -Bladder scan to assess if the patient is retaining urine and he was Retaining; Given an I and O Cath; Repeat Bladder Scan showing that he is retaining urine so will place a Foley catheter;  see below -Labs are within normal limits and mildly hypernatremic; renal function slightly worsened so we have started him on D5W at 75 mL/hr  -Given p.o. Zyprexa 5 mg sublingually and Dr. Cheral Marker recommends discontinuing this medication as well as discontinuing the Atarax.  He recommends starting trazodone 100 g p.o. nightly and continue to monitor the patient's clinical response to interventions and also obtaining an EEG and MRI.  Dr. Cheral Marker recommends doing a trial and error with opiates and recommends that if he does not improve holding his opiates but feels that if he is in pain given the opiates may help with his delirium -continue to reorient frequently and continue with delirium precautions -Have family sitter at bedside and will continue  -Continue with bowel regimen and pain control -Not in restraints -Consulted Psychiatry Dr. Berniece Andreas and recommending continuing current plan of care and if necessary then give 1-2 mg of IV/IM Lorazepam for Severe Agitation but may need to hold this given his age -Since he is not improving we will obtain an MRI of the brain with and without contrast as well as an EEG -C/w Delirium Precautions -Pending results may need further neurological work-up and evaluation and formal Consultation -Will also consult Palliative Care for GOC Discussion and Symptom Management  Hypertension -Intermittently hypertensive in ED; Last blood Pressure was  -Patient takes Lisinopril 20 mg po Daily at home and was held initially but resumed at 10 mg p.o. daily but will now hold given his mildly elevated  Renal Fxn today  -Continue metoprolol tartrate 12.5 milligrams p.o. twice daily -If needed can add on IV Hydralazine -Continue to monitor blood pressures per protocol and last blood pressure was 100/48  Acute Urinary Retention -Patient continues to retain his urine despite multiple in and out catheterizations so we will plan for catheter -We will consider adding tamsulosin  if patient allows  Hyperglycemia in the setting of prediabetes -Patient's Blood Sugar has been elevated on Daily BMP/CMP; on Admission was 101 and repeat was 136  -Check HbA1c and was 5.7 -Continue to Monitor Blood Sugars carefully and if Necessary will place on Sensitive Novolog SSI AC -Patient's blood sugars have been ranging from 98-141 daily on BMP/CMP; Blood Sugar was 114 on last check   Dysphagia/Concern for Aspiration -Nursing had noted that he was gagging coughing up with an RN try to give him fluids and he spit out his medications and did not suck from straw -SLP evaluated and recommended MBS -MBS done and they recommended dysphagia 2 diet with thin liquids and no straws  Thrombocytopenia -Mild as Platelet Count went from 154 -> 145 and is now improved further and is now normal at 183 yesterday  -Continue to Monitor for S/Sx of Bleeding; Currently no overt bleeding noted -Repeat CBC in the AM   Hyperbilirubinemia, worsened  -Patient's T bili was 2.1 is now improved to 1.5 but started worsening again and went to 1.8 yesterday and now 3.8 today -Likely reactive but will obtain a right upper quadrant ultrasound -Patient's direct bilirubin is 0.4 and indirect was 1.7 on 12/08/2020 -Right upper quadrant ultrasound shows "Gallbladder sludge and small stones without additional ultrasound features to suggest acute cholecystitis. No evidence of biliary ductal dilatation." -Continue to Monitor trend and repeat CMP in a.m.  AKI on likely CKD stage IIIa -Patient BUNs/creatinine trended up to 25/1.71 -Improved with IV fluid hydration is now trended down to 24/1.11 the day before yesterday and has now slightly worsened and is 31/1.26; now we will start him on D5W at 75 mL's per hour -Continue monitor and trend and avoid nephrotoxic medications, contrast dyes, hypotension and renally dose medications -Repeat CMP in a.m.  Macrocytic Anemia -Postoperatively patient's hemoglobin/hematocrit  remained stable today at 10.9/30.3 and MCV is 100.6 -Check anemia panel and showed an iron level of 45, U IBC of 190, TIBC of 235, saturation ratios of 19%, ferritin level 117, folate level 29.2, and vitamin B12 level is 157 -Continue to monitor for signs and symptoms of bleeding; currently no overt bleeding noted -Repeat CBC in a.m.  HLD -Takes Atorvastatin 20 mg po Daily and will resume but may need to hold   Mildly elevated AST -Patient's AST is now 43 -Continue monitor and trend and right upper quadrant ultrasound as above -May need to hold his hydrocodone-acetaminophen as well as Dilaudid and just schedule Tylenol -Repeat CMP in a.m.  CT findings -CT head was without acute process, but incidental 1.7 cm hyperdense region noticed in the Left Frontal White matter that could reflect an area of dysplasia or vascular malformation -Follow-up nonemergent MRI with and without contrast recommended to better characterize and exclude underlying mass lesion  -Can be done after Hip procedure or in the outpatient setting  -Dr. Ashley Royalty spoke with the patient's son [name] and they did not want to pursue further work-up on this but may need further head imaging now that he is acutely delirious and extremely confused and combative caution with the neurologist he recommends obtaining MRI for completeness  GOC: DNR, poA  DVT prophylaxis: Enoxaparin 30 mg subcu every 24 Code Status: DO NOT RESUSCITATE  Family Communication: Discussed with Daughter in Law at bedside  Disposition Plan: SNF when bed is available as he is medically stable to be discharged now that his delirium has improved significantly  Status is: Inpatient  Remains inpatient appropriate because:Unsafe d/c plan, IV treatments appropriate due to intensity of illness or inability to take PO and Inpatient level of care appropriate due to severity of illness   Dispo: The patient is from: Home              Anticipated d/c is to: SNF               Anticipated d/c date is: 1-2 days              Patient currently is medically stable to d/c.   Consultants:   Orthopedic Surgery  Palliative Care Medicine    Procedures:  CPT 27245-Cephalomedullary nailing of left intertrochanteric femur fracture done by Dr. Jena Gauss on 07-Dec-2020  Antimicrobials:  Anti-infectives (From admission, onward)   Start     Dose/Rate Route Frequency Ordered Stop   12/07/2020 2200  ceFAZolin (ANCEF) IVPB 2g/100 mL premix        2 g 200 mL/hr over 30 Minutes Intravenous Every 8 hours 12/07/20 1647 12/04/20 1530   12/07/20 1450  vancomycin (VANCOCIN) powder  Status:  Discontinued          As needed 12-07-20 1451 07-Dec-2020 1543   12-07-20 1402  ceFAZolin (ANCEF) 2-4 GM/100ML-% IVPB       Note to Pharmacy: Aquilla Hacker   : cabinet override      2020/12/07 1402 12/04/20 0214        Subjective: Seen and examined at bedside and remains significantly confused and extremely agitated and was trying to pull off his gown today.  Grandson was at bedside and patient has not rested and continues to be restless and agitated.  Unable to provide a subjective history due to his current condition.  Nursing reports that he did not sleep very well last night.  No other concerns or complaints at this time.  Objective: Vitals:   12/11/20 1605 12/11/20 2205 12/12/20 0330 12/12/20 1029  BP: 138/81 135/69 132/70 (!) 157/95  Pulse: 70 69 70 93  Resp: 16 18 18    Temp: 97.7 F (36.5 C) 97.8 F (36.6 C) 98 F (36.7 C)   TempSrc: Oral Oral Oral   SpO2: 95% 94% 96% 91%  Weight:      Height:        Intake/Output Summary (Last 24 hours) at 12/12/2020 1447 Last data filed at 12/12/2020 0256 Gross per 24 hour  Intake --  Output 350 ml  Net -350 ml   Filed Weights   12/04/20 1600  Weight: 61.9 kg   Examination: Physical Exam:  Constitutional: Patient is a thin elderly Caucasian male who is extremely confused and agitated and unable to provide a subjective history given  his current condition  Eyes: Lids and conjunctivae normal, sclerae anicteric  ENMT: Nose appear normal.  Some ear lesions on the left ear Neck: Appears normal, supple, no cervical masses, normal ROM, no appreciable thyromegaly; no JVD Respiratory: Diminished to auscultation bilaterally, no wheezing, rales, rhonchi or crackles. Normal respiratory effort and patient is not tachypenic. No accessory muscle use.  Slightly labored breathing Cardiovascular: RRR, no murmurs / rubs / gallops. S1 and S2 auscultated.  Abdomen: Soft, non-tender, non-distended.  Bowel sounds positive x4.  GU: Deferred. Musculoskeletal: No clubbing / cyanosis of digits/nails. No joint deformity upper and lower extremities.  Skin: No rashes, lesions, ulcers on limited skin evaluation but does have some erythema bruising on the left hip. No induration; Warm and dry.  Neurologic: Is not awake enough and extremely confused to follow commands Psychiatric: Impaired judgment and insight.  He is awake but not alert and oriented x 3.  Extremely agitated mood and appropriate affect.   Data Reviewed: I have personally reviewed following labs and imaging studies  CBC: Recent Labs  Lab 12/07/20 0803 12/08/20 0918 12/09/20 0206 12/10/20 0129 12/12/20 0314  WBC 7.9 8.6 6.2 9.7 8.2  NEUTROABS  --  6.8 4.8 8.2* 6.9  HGB 9.2* 10.7* 9.9* 11.0* 10.9*  HCT 27.6* 33.6* 31.1* 33.3* 33.3*  MCV 98.9 100.3* 100.3* 99.1 100.6*  PLT 162 211 183 258 259   Basic Metabolic Panel: Recent Labs  Lab 12/07/20 0803 12/08/20 0254 12/08/20 0918 12/09/20 0206 12/10/20 0129 12/12/20 0314  NA 141 143  --  141 142 146*  K 3.6 3.5  --  3.6 4.0 4.1  CL 104 103  --  105 104 108  CO2 26 27  --  27 27 27   GLUCOSE 102* 98  --  99 114* 96  BUN 27* 26*  --  23 24* 31*  CREATININE 1.12 1.19  --  1.06 1.11 1.26*  CALCIUM 8.5* 8.6*  --  8.0* 8.4* 8.8*  MG  --   --  2.0 1.9 2.1 2.0  PHOS  --   --  3.1 3.4 3.9 3.1   GFR: Estimated Creatinine  Clearance: 32.8 mL/min (A) (by C-G formula based on SCr of 1.26 mg/dL (H)). Liver Function Tests: Recent Labs  Lab 12/07/20 0803 12/08/20 0918 12/09/20 0206 12/10/20 0129 12/12/20 0314  AST 50* 44* 31 32 43*  ALT 14 16 14 16 24   ALKPHOS 67 81 71 82 81  BILITOT 1.5* 2.1* 1.5* 1.8* 3.8*  PROT 5.2* 6.0* 5.2* 5.7* 5.8*  ALBUMIN 2.6* 3.1* 2.7* 3.0* 3.0*   Recent Labs  Lab 12/12/20 0314  LIPASE 23   No results for input(s): AMMONIA in the last 168 hours. Coagulation Profile: No results for input(s): INR, PROTIME in the last 168 hours. Cardiac Enzymes: No results for input(s): CKTOTAL, CKMB, CKMBINDEX, TROPONINI in the last 168 hours. BNP (last 3 results) No results for input(s): PROBNP in the last 8760 hours. HbA1C: No results for input(s): HGBA1C in the last 72 hours. CBG: No results for input(s): GLUCAP in the last 168 hours. Lipid Profile: No results for input(s): CHOL, HDL, LDLCALC, TRIG, CHOLHDL, LDLDIRECT in the last 72 hours. Thyroid Function Tests: No results for input(s): TSH, T4TOTAL, FREET4, T3FREE, THYROIDAB in the last 72 hours. Anemia Panel: No results for input(s): VITAMINB12, FOLATE, FERRITIN, TIBC, IRON, RETICCTPCT in the last 72 hours. Sepsis Labs: No results for input(s): PROCALCITON, LATICACIDVEN in the last 168 hours.  Recent Results (from the past 240 hour(s))  Resp Panel by RT-PCR (Flu A&B, Covid) Nasopharyngeal Swab     Status: None   Collection Time: 11/19/2020  3:52 PM   Specimen: Nasopharyngeal Swab; Nasopharyngeal(Robertson) swabs in vial transport medium  Result Value Ref Range Status   SARS Coronavirus 2 by RT PCR NEGATIVE NEGATIVE Final    Comment: (NOTE) SARS-CoV-2 target nucleic acids are NOT DETECTED.  The SARS-CoV-2 RNA is generally detectable in upper respiratory specimens during the acute phase of infection. The lowest  concentration of SARS-CoV-2 viral copies this assay can detect is 138 copies/mL. A negative result does not preclude  SARS-Cov-2 infection and should not be used as the sole basis for treatment or other patient management decisions. A negative result may occur with  improper specimen collection/handling, submission of specimen other than nasopharyngeal swab, presence of viral mutation(s) within the areas targeted by this assay, and inadequate number of viral copies(<138 copies/mL). A negative result must be combined with clinical observations, patient history, and epidemiological information. The expected result is Negative.  Fact Sheet for Patients:  BloggerCourse.com  Fact Sheet for Healthcare Providers:  SeriousBroker.it  This test is no t yet approved or cleared by the [place] FDA and  has been authorized for detection and/or diagnosis of SARS-CoV-2 by FDA under an Emergency Use Authorization (EUA). This EUA will remain  in effect (meaning this test can be used) for the duration of the COVID-19 declaration under Section 564(b)(1) of the Act, 21 U.S.C.section 360bbb-3(b)(1), unless the authorization is terminated  or revoked sooner.       Influenza A by PCR NEGATIVE NEGATIVE Final   Influenza B by PCR NEGATIVE NEGATIVE Final    Comment: (NOTE) The Xpert Xpress SARS-CoV-2/FLU/RSV plus assay is intended as an aid in the diagnosis of influenza from Nasopharyngeal swab specimens and should not be used as a sole basis for treatment. Nasal washings and aspirates are unacceptable for Xpert Xpress SARS-CoV-2/FLU/RSV testing.  Fact Sheet for Patients: BloggerCourse.com  Fact Sheet for Healthcare Providers: SeriousBroker.it  This test is not yet approved or cleared by the [place] FDA and has been authorized for detection and/or diagnosis of SARS-CoV-2 by FDA under an Emergency Use Authorization (EUA). This EUA will remain in effect (meaning this test can be used) for the duration of  the COVID-19 declaration under Section 564(b)(1) of the Act, 21 U.S.C. section 360bbb-3(b)(1), unless the authorization is terminated or revoked.  Performed at [facility], [address], [place], [place] [address]      RN Pressure Injury Documentation:     Estimated body mass index is 20.15 kg/m as calculated from the following:   Height as of this encounter: 5\' 9"  (1.753 m).   Weight as of this encounter: 61.9 kg.  Malnutrition Type:   Malnutrition Characteristics:   Nutrition Interventions:     Radiology Studies: Abdomen Limited RUQ (LIVER/GB)  Result Date: 12/12/2020 CLINICAL DATA:  Hyperbilirubinemia EXAM: ULTRASOUND ABDOMEN LIMITED RIGHT UPPER QUADRANT COMPARISON:  None. FINDINGS: Gallbladder: No evidence of gallbladder wall thickening or pericholecystic fluid. Small amount of sludge and tiny echogenic foci consistent with stones present within the gallbladder lumen. Common bile duct: Diameter: Normal at 3 mm Liver: No focal lesion identified. Within normal limits in parenchymal echogenicity. Portal vein is patent on color Doppler imaging with normal direction of blood flow towards the liver. Other: None. IMPRESSION: 1. Gallbladder sludge and small stones without additional ultrasound features to suggest acute cholecystitis. 2. No evidence of biliary ductal dilatation. Electronically Signed   By: 02/09/2021 M.D.   On: 12/12/2020 10:28   Scheduled Meds: . Chlorhexidine Gluconate Cloth  6 each Topical Daily  . enoxaparin (LOVENOX) injection  30 mg Subcutaneous Q24H  . lisinopril  10 mg Oral Daily  . metoprolol tartrate  12.5 mg Oral BID  . OLANZapine zydis  5 mg Oral Daily  . polyethylene glycol  17 g Oral Daily   Continuous Infusions: . dextrose 75 mL/hr at 12/12/20 1055  LOS: 10 days   Merlene Laughtermair Latif Seraiah Nowack, DO [facility] PAGER is on AMION  If 7PM-7AM, please contact night-coverage [email or web address]

## 2020-12-12 DEATH — deceased

## 2020-12-13 DIAGNOSIS — M810 Age-related osteoporosis without current pathological fracture: Secondary | ICD-10-CM | POA: Diagnosis not present

## 2020-12-13 DIAGNOSIS — I10 Essential (primary) hypertension: Secondary | ICD-10-CM | POA: Diagnosis not present

## 2020-12-13 DIAGNOSIS — Z515 Encounter for palliative care: Secondary | ICD-10-CM

## 2020-12-13 DIAGNOSIS — S72142A Displaced intertrochanteric fracture of left femur, initial encounter for closed fracture: Secondary | ICD-10-CM | POA: Diagnosis not present

## 2020-12-13 DIAGNOSIS — Z7189 Other specified counseling: Secondary | ICD-10-CM | POA: Diagnosis not present

## 2020-12-13 DIAGNOSIS — Z66 Do not resuscitate: Secondary | ICD-10-CM | POA: Diagnosis not present

## 2020-12-13 DIAGNOSIS — W19XXXA Unspecified fall, initial encounter: Secondary | ICD-10-CM | POA: Diagnosis not present

## 2020-12-13 MED ORDER — MORPHINE SULFATE (CONCENTRATE) 10 MG/0.5ML PO SOLN: 10.0000 mg | Freq: Four times a day (QID) | ORAL | Status: DC

## 2020-12-13 MED ORDER — LORAZEPAM 2 MG/ML IJ SOLN
1.0000 mg | INTRAMUSCULAR | Status: DC
  Administered 2020-12-13: 1 mg via INTRAVENOUS

## 2020-12-13 MED ORDER — LORAZEPAM 2 MG/ML PO CONC: 2.0000 mg | Freq: Four times a day (QID) | ORAL | Status: DC

## 2020-12-13 MED ORDER — ATROPINE SULFATE 1 % OP SOLN
2.0000 [drp] | Freq: Four times a day (QID) | OPHTHALMIC | Status: DC
  Filled 2020-12-13: qty 2

## 2021-01-12 NOTE — Progress Notes (Signed)
At 2245 two RN's pronounced that patient has expired. Physician on-call and  family notified. RN spoke to [employer] and [place].

## 2021-01-12 NOTE — Progress Notes (Signed)
   Palliative Medicine Inpatient Follow Up Note  Reason for consult:  Goals of Care "GOC Discussion; Severe Agitation and Delirium"  HPI:  Per intake H&P --> [name] a 87 y.o.malewith medical history significant ofhypertensionwho presents with hip pain following a fall from standing height. Patient was reaching to pick up a newspaper from the ground and fell on his left side. He reports severe left hip pain which is worse with any movement. Pain is 10 out of 10 at its worst but is improved to moderate pain now.He is been unable to walk since the fall. He does walk with a cane at baseline. He denies any head trauma or taking any anticoagulation. He has 2 sons who the EDP spoke with who confirmed that he is on minimal medications only antihypertensive, aspirin and vitamins. He denies fever, cough, chest pain, shortness of breath, abdominal pain, constipation, diarrhea, nausea.  [name] of care was asked to get involved in the setting of unremitting severe delirium.  Psychiatry involved without improvement.  Per discussion with patient's bedside nurse Tammy family is interested in palliative measures.  Today's Discussion (January 11, 2021): Chart reviewed. Upon assessment patient was noted to have dislodged his IV. I spoke to his nurse regarding this as he was on a continuous morphine gtt. She placed a STAT order to the IV team. In the meanwhile I added oral equivalents of comfort medications.   Touched base with RN this early afternoon. Patient was able to get another IV and reinitiated on morphine gtt. Received boluses per nursing to control symptoms and a 1mg  dose of ativan. Appears to be in a more comfortable state.  Discussed the importance of continued conversation with family and their  medical providers regarding overall plan of care and treatment options, ensuring decisions are within the context of the patients values and GOCs.  Questions and concerns addressed    Objective Assessment: Vital Signs Vitals:   12/12/20 1447 January 11, 2021 0817  BP: (!) 100/48 125/82  Pulse: 88 88  Resp:  (!) 22  Temp: 98.9 F (37.2 C) 97.7 F (36.5 C)  SpO2:  95%    Intake/Output Summary (Last 24 hours) at January 11, 2021 1310 Last data filed at Jan 11, 2021 0400 Gross per 24 hour  Intake 10.79 ml  Output 750 ml  Net -739.21 ml   Last Weight  Most recent update: 12/04/2020  4:42 PM   Weight  61.9 kg (136 lb 7.4 oz)           Gen: Frail elderly male somnolent  HEENT: moist mucous membranes CV: Regular rate and rhythm PULM: ON RA rhonchi auscultation bilaterally ABD: soft/nontender EXT: Plus edema on left lower extremity Neuro: Somnolent  SUMMARY OF RECOMMENDATIONS DNAR/DNI  Comfort focused care  Medications per [facility]  Liberalize visitation policy  Transitions of care team consultation for inpatient hospice placement  Time Spent: 25 Greater than 50% of the time was spent in counseling and coordination of care ______________________________________________________________________________________ [facility] [place] Palliative Medicine Team Team Cell Phone: [phone] Please utilize secure chat with additional questions, if there is no response within 30 minutes please call the above phone number  Palliative Medicine Team providers are available by phone from 7am to 7pm daily and can be reached through the team cell phone.  Should this patient require assistance outside of these hours, please call the patient's attending physician.

## 2021-01-12 NOTE — Death Summary Note (Signed)
DEATH SUMMARY   Patient Details  Name: Tom Robertson MRN: [id number] DOB: [birth year]  Admission/Discharge Information   Admit Date:  2020/12/08  Date of Death: Date of Death: 2020/12/19  Time of Death: Time of Death: February 24, 2244  Length of Stay: 02/26/2023  Referring Physician: Roetta Sessions, NP   Reason(s) for Hospitalization  Fall and Hip Fracture  Diagnoses  Preliminary cause of death:    Acute cardiopulmonary arrest in the setting of his extreme delirium encephalopathy postoperatively for his intertrochanteric fracture of left hip femur in the setting of a fall complicated by acute urinary tension and worsening hyperbilirubinemia  Secondary Diagnoses (including complications and co-morbidities):   AcuteFall Osteoporosis Intertrochanteric fracture of the left femurin the setting of Fall status post cephalomedullary nailing on 12/04/2019 -Patient had a fall from standing height while reaching to pick a newspaper up off the ground. No preceding symptoms no loss of consciousness typically walks with a cane. -Did not hit his head, not on anticoagulation -Orthopedic surgery evaluated and patient was taken to surgery on 12/05/2020 -With fracture from fall from standing height meets definition for osteoporosis. -Appreciate orthopedics recommendations -Pain controlinitiated and c/w Hydrocodone-Acetaminophen 1 tab po q6hprn Moderate Pain, and Hydromorphone 0.5 mg IV q4hprn Severe Pain; Given IV Morphine 2 mg in the ED; now patient remains on a morphine drip given that family has decided to transition to comfort measures given his extreme agitation, combativeness and encephalopathy -C/w Miralax 17 grams po daily as well as Dailyprn -Hip fracture precautions -Takes Aspirin 81 mg po Daily at Home this was held for surgery and not resume now given that he is full comfort measures -IVF has now stopped  -AM labsrepeated, check vitamin D level -Further Care per Orthopedic Surgery and Dr.  Doreatha Martin evaluated and recommended that the patient will require a cephalomedullary nailing of his left hip fracture.  Risks and benefits were discussed and he underwent surgical intervention on 12/06/2020 afternoon. -Orthopedic surgery recommending weightbearing as tolerated on the left lower extremity and maintaining his Mepilex dressings and recommending Lovenox and SCDs as well as ambulation for VT prophylaxis as well as enoxaparin 30 mg subcu every 12 hours -PT OT recommending skilled nursing facility and stable to D/C but now likely is more residential hospice appropriate -At baseline he walks with a cane -Patient has been transitioned to comfort measures only and will not follow-up with Dr. Doreatha Martin in 2 weeks after and anticipating in hospital death if he is not transition to residential hospice  Acute Delirium with significant confusion and agitation and acute encephalopathy, had initially improved but now continues to worsen -Overnight on 12/28-12/29 he tried get out of bed and started having loud outbursts.  He was given 1 mg of IM Ativan the night before last and security was called to help calm him down  -Bladder scan to assess if the patient is retaining urine and he was Retaining; Given an I and O Cath; Repeat Bladder Scan showing that he is retaining urine so will place a Foley catheter; see below -Labs are within normal limits and mildly hypernatremic; renal function slightly worsened so we have started him on D5W at 75 mL/hr  -Given p.o. Zyprexa 5 mg sublingually and Dr. Cheral Marker recommends discontinuing this medication as well as discontinuing the Atarax.  He recommends starting trazodone 100 g p.o. nightly and continue to monitor the patient's clinical response to interventions and also obtaining an EEG and MRI.  Dr. Cheral Marker recommends doing a trial and error with opiates  and recommends that if he does not improve holding his opiates but feels that if he is in pain given the opiates may  help with his delirium -continue to reorient frequently and continue with delirium precautions -Have family sitter at bedside and will continue  -Continue with bowel regimen and pain control -Not in restraints -Consulted Psychiatry Dr. Berniece Andreas and recommending continuing current plan of care and if necessary then give 1-2 mg of IV/IM Lorazepam for Severe Agitation but may need to hold this given his age -Since he is not improving we will obtain an MRI of the brain with and without contrast as well as an EEG this was canceled after palliative care goals of care discussion -C/w Delirium Precautions -Pending results may need further neurological work-up and evaluation and formal Consultation but we will not pursue this given that family has elected for comfort -Will also consult Palliative Care for [place] Discussion and Symptom Management; palliative care met with the patient and after goals of care discussion family elected to transition the patient to full comfort measures and all medications not in the patient's comfort were discontinued including but not limited to, acetaminophen rectally every 6 hours as needed for fever or mild pain, biotin antiseptic oral rinse 50 mg as needed for dry mouth, bisacodyl suppository 10 mg rectally for constipation, glycopyrrolate 0.4 mg IV every 4 as needed for secretions, diazepam 0.5 to 1 mg every 1 hour for sedation anxiety, morphine infusion 1 to 5 mg continuous and 2 mg IV bolus every 30 minutes as needed for dyspnea pain as well as ondansetron 4 mg IV every 6 as needed for nausea vomiting and Liquifilm tears 1 drop in both eyes 4 times daily as needed dry eyes  Hypertension -Intermittently hypertensive in ED; Last blood Pressure was  -Patient takes Lisinopril 20 mg po Daily at home and was held initially but resumed at 10 mg p.o. daily but will now hold given his mildly elevated Renal Fxn and will now stop giving the patient is comfortable  measures -Discontinued metoprolol tartrate 12.5 milligrams p.o. twice daily now that he is comfort measures -Blood pressure is now 125/82 today  Acute Urinary Retention -Patient continues to retain his urine despite multiple in and out catheterizations indwelling Foley catheter was placed yesterday and will continue for comfort  Hyperglycemia in the setting of prediabetes -Checked HbA1c and was 5.7 -Continue to Monitor Blood Sugars carefully and if Necessary will place on Sensitive Novolog SSI AC -Patient's blood sugars have been ranging from 98-141 daily on BMP/CMP; Blood Sugar was 114 on last check but will not check anymore given that he is full comfort measures now  Dysphagia/Concern for Aspiration -Nursing had noted that he was gagging coughing up with an RN try to give him fluids and he spit out his medications and did not suck from straw -SLP evaluated and recommended MBS -MBS done and they recommended dysphagia 2 diet with thin liquids and no straws; now he is full comfort measures and on the morphine drip  Thrombocytopenia -Mild as Platelet Count went from 154 -> 145 and is now improved further and is now normal at 183 the day before yesterday and yesterday was 259 -Continue to Monitor for S/Sx of Bleeding; Currently no overt bleeding noted -We will not repeat CBC now the patient is fully comfort measures  Hyperbilirubinemia, worsened  -Patient's T bili was 2.1 is now improved to 1.5 but started worsening again and went to 1.8 yesterday and now 3.8 today -  Likely reactive but will obtain a right upper quadrant ultrasound -Patient's direct bilirubin is 0.4 and indirect was 1.7 on 12/08/2020 -Right upper quadrant ultrasound shows "Gallbladder sludge and small stones without additional ultrasound features to suggest acute cholecystitis. No evidence of biliary ductal dilatation." -We will not continue to monitor and trend as patient is now full comfort measures and on a morphine  drip and has a very high risk of in-hospital death transferto residential hospice  AKI on likely CKD stage IIIa -Patient BUNs/creatinine trended up to 25/1.71 -Improved with IV fluid hydration is now trended down to 24/1.11 the day before yesterday and has now slightly worsened and is 31/1.26; now we will start him on D5W at 75 mL's per hour -Continue monitor and trend and avoid nephrotoxic medications, contrast dyes, hypotension and renally dose medications -We will not monitor given that he is full comfort measures now  Macrocytic Anemia -Postoperatively patient's hemoglobin/hematocrit remained stable today at 10.9/30.3 and MCV is 100.6 -Check anemia panel and showed an iron level of 45, U IBC of 190, TIBC of 235, saturation ratios of 19%, ferritin level 117, folate level 29.2, and vitamin B12 level is 157 -We will not continue to monitor given that he is full comfort measures  HLD -Atorvastatin 20 mg p.o. daily has now been discontinued given that he is full comfort measures  Mildly elevated AST -Patient's AST is now 43 -RUQ obtained as above and now will not trend given transition to Comfort Measures  CT findings -CT head was without acute process, but incidental1.7 cmhyperdense region noticed in the Left Frontal White matter that could reflect an area of dysplasia or vascular malformation -Follow-up nonemergent MRI with and without contrast recommended to better characterize and exclude underlying mass lesion  -Can be done after Hip procedure or in the outpatient setting  -Dr. Zigmund Daniel spoke with the patient's son Richardson Landry and they did not want to pursue further work-up on this but may need further head imaging now that he is acutely delirious and extremely confused and combative caution with the neurologist he recommends obtaining MRI for completeness  GOC: DNR, poA -Now FULL COMFORT MEASURES and on a Morphine Gtt  Principal Problem:   Terminal care Active Problems:    Closed comminuted intertrochanteric fracture of left femur ([name])   Fall at home, initial encounter   HTN (hypertension)   Osteoporosis   Brief Hospital Course (including significant findings, care, treatment, and services provided and events leading to death)  HPI per Dr. Neva Seat on 11/21/2020 Memory Dance Milleris a 87 y.o.malewith medical history significant ofhypertensionwho presents with hip pain following a fall from standing height. Patient was reaching to pick up a newspaper from the ground and fell on his left side. He reports severe left hip pain which is worse with any movement. Pain is 10 out of 10 at its worst but is improved to moderate pain now.He is been unable to walk since the fall. He does walk with a cane at baseline. He denies any head trauma or taking any anticoagulation. He has 2 sons who the EDP spoke with who confirmed that he is on minimal medications only antihypertensive, aspirin and vitamins. He denies fever, cough, chest pain, shortness of breath, abdominal pain, constipation, diarrhea, nausea.  ED Course:Vitals in ED significant for intermittent hypertension in the 676H to 209O systolic. Some readings of low heart rate in the upper 50s. Lab work-up showed BMP that was normal, LFTs with calcium of 8.6which corrects considering albumin of  3.1, protein of 5.4. CBC within normal notes. Respiratory panel for flu and Covid pending. Patient was given pain medication and and orthopedics was consulted for his fracture. Chest x-ray was without acute disease. DG pelvis and DG left femur showed left intratrochanteric hip fracture. CT head was without acute process, but incidental hyperdense region noticed which may need follow-up MRI. CT C-spine showed multilevel spondylosis but no acute fracture. CT abdomen redemonstrated left femoral fracture otherwise showed no acute abdominal or pelvic process diverticulosis without diverticulitis noted  **Interim  History Patient was taken for surgical intervention for his intertrochanteric fracture of the left femur the setting of his acute fall on 11/13/2020. PT OT will need to evaluate the patient after surgical intervention to determine disposition and recommending SNF.  He was deemed medically stable to be discharged 12/08/20 but overnight became extremely confused and delirious.  He was very agitated on morning of 12/09/20 and given Zyprexa.  We have placed him on delirium precautions if he continues to be confused will work-up further with imaging currently feels that this is related to acute delirium.  Patient appeared to be calmer yesterday however this morning he had been calm combative and agitated again and had a rough night.  He is calmer after the olanzapine was given and because of his continued behavioral issues psychiatry was consulted for further evaluation and they recommended continue olanzapine and tapering down once his delirium resolves.  They have also recommended lorazepam 1 to 2 mg for severe agitation.  12/12/2020: Patient continues to be significantly agitated and combative and altered.  He remains encephalopathic and is likely delirious but will need to rule out other etiologies.  He is acutely retaining his urine so will place Foley catheter in for his acute urinary retention.  Will discuss with neurology about further head imaging prior to formal consultation and they recommend obtaining the MRI with and without contrast as well as an EEG and Dr. Cheral Marker made some recommendations about several medications and we have discontinued his olanzapine as well as his Atarax and started the patient on trazodone 100 g p.o. nightly.  Palliative care was consulted and further goals of care discussion patient was made comfort measures only and all interventions were stopped overnight and favor the patient's comfort he was placed on a morphine drip.  2021-01-07 Patient remained on a morphine drip and was  relatively comfortable today but then had some breakthrough pain and became agitated.  He was given as needed morphine.  Currently resting comfortably with family at bedside.  Anticipate in-hospital death if he is not transferred to residential hospice as he is a very high risk of further decompensation as prognosis is extremely poor.  **Case management was assisting with placement for end-of-life care and palliative care team have made the referral for inpatient hospice.  Unfortunately a local inpatient hospice beds were full at this time.  Is decided that the patient was to receive comfort measures at the floor and that no referral to inpatient hospice was going to be made as in hospital death was expected.  As expected patient further decompensated as expected and passed away on a morphine drip and had an hospital death on 01-07-2021 at 2244-03-14.  The physician on call and the family was notified and patient was pronounced by 2 RNs at bedside that he had expired.    Pertinent Labs and Studies  Significant Diagnostic Studies DG Abd 1 View  Result Date: 12/06/2020 CLINICAL DATA:  Nausea and vomiting. EXAM:  ABDOMEN - 1 VIEW COMPARISON:  None. FINDINGS: Bowel gas pattern is nonobstructive. Air and stool present throughout the colon. No free peritoneal air. Moderate degenerative change of the spine. Surgical clips are present over the pelvis. Partially visualized fixation hardware over the left femoral neck. IMPRESSION: Nonobstructive bowel gas pattern. Electronically Signed   By: Marin Olp M.D.   On: 12/06/2020 15:57   CT Head Wo Contrast  Result Date: 12/07/2020 CLINICAL DATA:  [place] from standing EXAM: CT HEAD WITHOUT CONTRAST TECHNIQUE: Contiguous axial images were obtained from the base of the skull through the vertex without intravenous contrast. COMPARISON:  None. FINDINGS: Brain: Confluent hypodensities throughout the periventricular and subcortical white matter are compatible with chronic small  vessel ischemic changes. Hyperdense area in the left frontal periventricular white matter measuring approximately 1.7 cm is indeterminate, but could reflect an area of cortical dysplasia or vascular malformation. Follow-up nonemergent MRI may be useful to better characterize and exclude underlying mass. No evidence of acute infarct or hemorrhage. Lateral ventricles and remaining midline structures are unremarkable. No acute extra-axial fluid collections. No mass effect. Diffuse age-appropriate cerebral atrophy. Vascular: No evidence of hyperdense vessel. Mild atherosclerosis. 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 Skull: Normal. Negative for fracture or focal lesion. Sinuses/Orbits: No acute finding. Other: None. IMPRESSION: 1. No acute intracranial process. 2. Extensive chronic small-vessel ischemic changes throughout the white matter. 3. Likely incidental 1.7 cm hyperdense region in the left frontal white matter, which could reflect an area of dysplasia or vascular malformation. Follow-up nonemergent MRI with and without contrast may be useful to better characterize and exclude underlying mass lesion. Electronically Signed   By: Randa Ngo M.D.   On: 11/12/2020 18:53   CT Cervical Spine Wo Contrast  Result Date: 11/22/2020 CLINICAL DATA:  [place] from standing, left hip fracture EXAM: CT CERVICAL SPINE WITHOUT CONTRAST TECHNIQUE: Multidetector CT imaging of the cervical spine was performed without intravenous contrast. Multiplanar CT image reconstructions were also generated. COMPARISON:  None. FINDINGS: Alignment: Alignment is anatomic. Skull base and vertebrae: No acute fracture. No primary bone lesion or focal pathologic process. Soft tissues and spinal canal: No prevertebral fluid or swelling. No visible canal hematoma. Disc levels: There is diffuse cervical spondylosis. Mild neural foraminal narrowing is seen at C3-4 and C4-5. No evidence of central canal stenosis. Upper  chest: Airway is patent.  Lung apices are clear. Other: Reconstructed images demonstrate no additional findings. IMPRESSION: 1. Extensive multilevel cervical spondylosis. No acute displaced fracture. Electronically Signed   By: Randa Ngo M.D.   On: 12/08/2020 18:44   CT ABDOMEN PELVIS W CONTRAST  Result Date: 11/30/2020 CLINICAL DATA:  Left lower quadrant abdominal pain, recent fall, left hip fracture EXAM: CT ABDOMEN AND PELVIS WITH CONTRAST TECHNIQUE: Multidetector CT imaging of the abdomen and pelvis was performed using the standard protocol following bolus administration of intravenous contrast. CONTRAST:  156mL OMNIPAQUE IOHEXOL 300 MG/ML  SOLN COMPARISON:  None. FINDINGS: Lower chest: No acute pleural or parenchymal lung disease. Dense calcification of the mitral annulus. Hepatobiliary: Small calcified gallstones without cholecystitis. No evidence of liver injury. Pancreas: Unremarkable. No pancreatic ductal dilatation or surrounding inflammatory changes. Spleen: Multiple small hypodensities in the spleen likely reflects cysts or hemangiomas. No evidence of splenic injury. Adrenals/Urinary Tract: There is bilateral renal cortical atrophy. No evidence of renal injury. Bilateral renal cortical cysts are noted. No nephrolithiasis or obstructive uropathy. The bladder and adrenals are unremarkable. Stomach/Bowel: No bowel obstruction or ileus. There is diverticulosis  of the distal colon with no evidence of acute diverticulitis. There is a short segment of wall thickening within the mid sigmoid colon reference image 52, without surrounding inflammatory change. This may be due to under distension or scarring from previous bouts of diverticulitis. If patient has bowel symptoms,, follow-up nonemergent colonoscopy or barium enema could be considered. Vascular/Lymphatic: Aortic atherosclerosis. No enlarged abdominal or pelvic lymph nodes. Reproductive: Prostate is likely surgically absent. Other: No free fluid  or free intraperitoneal gas. No abdominal wall hernia. Musculoskeletal: The comminuted intertrochanteric fracture seen on previous imaging is again identified, with impaction and mild varus angulation. No dislocation. No other acute displaced fractures. Reconstructed images demonstrate no additional findings. IMPRESSION: 1. Comminuted impacted left femoral neck fracture. 2. Otherwise no acute intra-abdominal or intrapelvic trauma. 3. Diverticulosis of the colon without evidence of acute diverticulitis. Segmental wall thickening of the sigmoid colon may be due to scarring or under distension. 4. Cholelithiasis without cholecystitis. 5.  Aortic Atherosclerosis (ICD10-I70.0). Electronically Signed   By: Randa Ngo M.D.   On: 12/08/2020 19:03   DG Pelvis Portable  Result Date: 12/01/2020 CLINICAL DATA:  [place], left leg deformity EXAM: PORTABLE PELVIS 1-2 VIEWS COMPARISON:  None. FINDINGS: Single frontal view of the pelvis demonstrates an intertrochanteric left hip fracture with mild varus angulation and significant distraction of the fracture fragments. No dislocation. No additional bony abnormalities. Mild symmetrical hip osteoarthritis. Diffuse lower lumbar spondylosis. Extensive vascular calcifications. IMPRESSION: 1. Comminuted intertrochanteric left hip fracture with varus angulation. Electronically Signed   By: Randa Ngo M.D.   On: 12/10/2020 17:19   DG Chest Portable 1 View  Result Date: 12/08/2020 CLINICAL DATA:  [place], left hip fracture EXAM: PORTABLE CHEST 1 VIEW COMPARISON:  None. FINDINGS: Single frontal view of the chest demonstrates an unremarkable cardiac silhouette. No airspace disease, effusion, or pneumothorax. No acute bony abnormalities. IMPRESSION: 1. No acute intrathoracic process. Electronically Signed   By: Randa Ngo M.D.   On: 12/04/2020 17:19   DG Swallowing Func-Speech Pathology  Result Date: 12/07/2020 Objective Swallowing Evaluation: Type of Study: MBS-Modified  Barium Swallow Study  Patient Details Name: RAHUL MALINAK MRN: [id number] Date of Birth: 1926/11/13 Today's Date: 12/07/2020 Time: SLP Start Time (ACUTE ONLY): 0910 -SLP Stop Time (ACUTE ONLY): 0930 SLP Time Calculation (min) (ACUTE ONLY): 20 min Past Medical History: Past Medical History: Diagnosis Date . HTN (hypertension)  Past Surgical History: No past surgical history on file. HPI: Pt is a 87 y.o. male with medical history significant of hypertension who presented with hip pain following a fall from standing height and inability to walk since the fall. Pt sustained intertrochanteric fracture of the left femur and is s/p surgery. Abdominal imaging: Nonobstructive bowel gas pattern. CT head negative for acute changes. Case d/w with Daune Perch, RN who stated that the pt spat out his medication and will not suck from a straw; RN reported being concerned about the pt aspirating and therefore paged the MD for a swallow evaluation.  Subjective: pleasant, alert Assessment / Plan / Recommendation CHL IP CLINICAL IMPRESSIONS 12/07/2020 Clinical Impression Patient presents with what is likely a chronic oropharyngeal dysphagia as it is unlikely his swallow function is impacted from deficits related to current hospitalization. Patient exhibited weak oral and pharyngeal control and bolus transit with all tested consistencies, resulting in piecemeal swallowing with heavier boluses, premature spillage to vallecular sinus with nectar thick, honey thick, puree solids, and to pyriform sinus with thin liquids. With puree solids, he had moderate amount of  vallecular residuals, and mild-mod pyriform residuals which would slowly clear with subsequent swallows. He had silent penetration and aspiration of trace amount before and during the swallow with thin liquids secondary to likely decreased sensory input and decreased airway protection. A cued cough and throat clear did aid in moving trace amount of penetrate out of the laryngeal  vestibule, but not able to clear aspirate. Patient exhibited penetration during swallow with nectar thick liquids which led to aspiration after of trace amount. No penetration or aspiration observed with honey thick liquids, however amount of residuals in pharynx increased. Patient also appeared with some decreased UES opening and suspected decreased intraoral pressure. Chin tuck did not prevent aspiration or help to clear pharyngeal residuals. Patient will be at moderate aspiration risk for the rest of his life, and so monitoring his diet toleration and using strategies to mitigate this risk will be important. SLP Visit Diagnosis Dysphagia, oropharyngeal phase (R13.12) Attention and concentration deficit following -- Frontal lobe and executive function deficit following -- Impact on safety and function Moderate aspiration risk   [facility] IP TREATMENT RECOMMENDATION 12/07/2020 Treatment Recommendations Therapy as outlined in treatment plan below   Prognosis 12/07/2020 Prognosis for Safe Diet Advancement Fair Barriers to Reach Goals Time post onset;Severity of deficits Barriers/Prognosis Comment dysphagia likely chronic [facility] IP DIET RECOMMENDATION 12/07/2020 SLP Diet Recommendations Dysphagia 2 (Fine chop) solids;Thin liquid Liquid Administration via Cup;No straw Medication Administration Crushed with puree Compensations Slow rate;Small sips/bites;Clear throat intermittently Postural Changes Seated upright at 90 degrees;Remain semi-upright after after feeds/meals (Comment)   [facility] IP OTHER RECOMMENDATIONS 12/07/2020 Recommended Consults -- Oral Care Recommendations Oral care BID Other Recommendations --   [facility] IP FOLLOW UP RECOMMENDATIONS 12/07/2020 Follow up Recommendations Skilled Nursing facility   [facility] IP FREQUENCY AND DURATION 12/07/2020 Speech Therapy Frequency (ACUTE ONLY) min 1 x/week Treatment Duration 1 week      [facility] IP ORAL PHASE 12/07/2020 Oral Phase Impaired Oral - Pudding Teaspoon -- Oral - Pudding Cup -- Oral -  Honey Teaspoon Weak lingual manipulation;Reduced posterior propulsion;Piecemeal swallowing;Lingual/palatal residue;Premature spillage;Decreased bolus cohesion;Delayed oral transit;Lingual pumping Oral - Honey Cup -- Oral - Nectar Teaspoon Lingual pumping;Weak lingual manipulation;Reduced posterior propulsion;Lingual/palatal residue;Piecemeal swallowing;Decreased bolus cohesion Oral - Nectar Cup Reduced posterior propulsion;Weak lingual manipulation;Delayed oral transit;Premature spillage Oral - Nectar Straw -- Oral - Thin Teaspoon Delayed oral transit;Premature spillage;Reduced posterior propulsion;Weak lingual manipulation Oral - Thin Cup Weak lingual manipulation;Delayed oral transit;Decreased bolus cohesion;Reduced posterior propulsion Oral - Thin Straw -- Oral - Puree Lingual pumping;Lingual/palatal residue;Weak lingual manipulation;Delayed oral transit;Decreased bolus cohesion;Reduced posterior propulsion;Piecemeal swallowing Oral - Mech Soft -- Oral - Regular -- Oral - Multi-Consistency -- Oral - Pill -- Oral Phase - Comment --  [facility] IP PHARYNGEAL PHASE 12/07/2020 Pharyngeal Phase Impaired Pharyngeal- Pudding Teaspoon -- Pharyngeal -- Pharyngeal- Pudding Cup -- Pharyngeal -- Pharyngeal- Honey Teaspoon Delayed swallow initiation-vallecula;Reduced pharyngeal peristalsis;Pharyngeal residue - valleculae;Pharyngeal residue - pyriform;Pharyngeal residue - posterior pharnyx Pharyngeal Material does not enter airway Pharyngeal- Honey Cup -- Pharyngeal -- Pharyngeal- Nectar Teaspoon Delayed swallow initiation-vallecula;Reduced pharyngeal peristalsis;Reduced airway/laryngeal closure;Penetration/Aspiration during swallow;Penetration/Apiration after swallow;Trace aspiration;Pharyngeal residue - valleculae;Pharyngeal residue - pyriform;Pharyngeal residue - posterior pharnyx Pharyngeal Material enters airway, remains ABOVE vocal cords then ejected out Pharyngeal- Nectar Cup Delayed swallow initiation-vallecula;Reduced  pharyngeal peristalsis;Reduced airway/laryngeal closure;Reduced laryngeal elevation;Penetration/Apiration after swallow;Trace aspiration;Pharyngeal residue - valleculae;Pharyngeal residue - pyriform Pharyngeal Material enters airway, passes BELOW cords without attempt by patient to eject out (silent aspiration) Pharyngeal- Nectar Straw -- Pharyngeal -- Pharyngeal- Thin Teaspoon Delayed swallow initiation-pyriform sinuses;Reduced airway/laryngeal  closure;Reduced tongue base retraction;Reduced epiglottic inversion;Reduced pharyngeal peristalsis;Penetration/Aspiration during swallow;Penetration/Aspiration before swallow;Trace aspiration;Pharyngeal residue - valleculae;Pharyngeal residue - pyriform;Pharyngeal residue - posterior pharnyx Pharyngeal Material enters airway, passes BELOW cords without attempt by patient to eject out (silent aspiration) Pharyngeal- Thin Cup Delayed swallow initiation-pyriform sinuses;Reduced pharyngeal peristalsis;Reduced airway/laryngeal closure;Reduced laryngeal elevation;Reduced tongue base retraction;Penetration/Aspiration during swallow;Penetration/Aspiration before swallow;Trace aspiration;Pharyngeal residue - valleculae;Pharyngeal residue - pyriform;Pharyngeal residue - posterior pharnyx Pharyngeal Material enters airway, passes BELOW cords without attempt by patient to eject out (silent aspiration) Pharyngeal- Thin Straw -- Pharyngeal -- Pharyngeal- Puree Delayed swallow initiation-vallecula;Reduced pharyngeal peristalsis;Pharyngeal residue - valleculae;Pharyngeal residue - pyriform;Pharyngeal residue - posterior pharnyx Pharyngeal -- Pharyngeal- Mechanical Soft -- Pharyngeal -- Pharyngeal- Regular -- Pharyngeal -- Pharyngeal- Multi-consistency -- Pharyngeal -- Pharyngeal- Pill -- Pharyngeal -- Pharyngeal Comment --  [facility] IP CERVICAL ESOPHAGEAL PHASE 12/07/2020 Cervical Esophageal Phase Impaired Pudding Teaspoon -- Pudding Cup -- Honey Teaspoon Reduced cricopharyngeal relaxation Honey  Cup Reduced cricopharyngeal relaxation Nectar Teaspoon Reduced cricopharyngeal relaxation Nectar Cup -- Nectar Straw -- Thin Teaspoon Reduced cricopharyngeal relaxation Thin Cup Reduced cricopharyngeal relaxation Thin Straw Reduced cricopharyngeal relaxation Puree Reduced cricopharyngeal relaxation Mechanical Soft -- Regular -- Multi-consistency -- Pill -- Cervical Esophageal Comment -- Sonia Baller, MA, CCC-SLP Speech Therapy [facility]             DG C-Arm 1-60 Min  Result Date: 12/02/2020 CLINICAL DATA:  Likely surgery, intramedullary nail of the left femur. EXAM: DG HIP (WITH OR WITHOUT PELVIS) 2-3V LEFT; DG C-ARM 1-60 MIN COMPARISON:  Radiographs December 02, 2020. FINDINGS: Fluoro time: 1 minutes and 24 seconds. Radiation 14.02 mGy. Seven C-arm fluoroscopic images were obtained intraoperatively and submitted for post operative interpretation. These images demonstrate intramedullary nail fixation of a left intertrochanteric femur fracture. Please see the performing provider's procedural report for further detail. IMPRESSION: Intraoperative fluoroscopic images, as detailed above. Electronically Signed   By: Margaretha Sheffield MD   On: 11/26/2020 15:42   DG HIP PORT UNILAT W OR W/O PELVIS 1V LEFT  Result Date: 12/09/2020 CLINICAL DATA:  Status post left hip fixation EXAM: DG HIP (WITH OR WITHOUT PELVIS) 1V PORT LEFT COMPARISON:  Intraoperative films from earlier in the same day. FINDINGS: Previously seen proximal left femoral fracture is again noted. Short medullary rod with fixation screws is now seen similar to that noted on prior operative films. Fracture fragments are in near anatomic alignment. IMPRESSION: Status post ORIF proximal left femoral fracture Electronically Signed   By: Inez Catalina M.D.   On: 11/23/2020 16:25   DG HIP UNILAT WITH PELVIS 2-3 VIEWS LEFT  Result Date: 12/11/2020 CLINICAL DATA:  Likely surgery, intramedullary nail of the left femur. EXAM: DG HIP (WITH OR WITHOUT  PELVIS) 2-3V LEFT; DG C-ARM 1-60 MIN COMPARISON:  Radiographs December 02, 2020. FINDINGS: Fluoro time: 1 minutes and 24 seconds. Radiation 14.02 mGy. Seven C-arm fluoroscopic images were obtained intraoperatively and submitted for post operative interpretation. These images demonstrate intramedullary nail fixation of a left intertrochanteric femur fracture. Please see the performing provider's procedural report for further detail. IMPRESSION: Intraoperative fluoroscopic images, as detailed above. Electronically Signed   By: Margaretha Sheffield MD   On: 11/17/2020 15:42   DG Femur Portable 1 View Left  Result Date: 11/25/2020 CLINICAL DATA:  [place], externally rotated left hip with obvious deformity EXAM: LEFT FEMUR PORTABLE 1 VIEW COMPARISON:  None. FINDINGS: 2 frontal views. There is a comminuted of the left femur are obtained intertrochanteric left hip fracture with varus angulation. The distal  aspect of the femur is externally rotated relative to the fracture site. There is no dislocation. Extensive vascular calcifications are seen. IMPRESSION: 1. Comminuted intertrochanteric left hip fracture. Electronically Signed   By: Randa Ngo M.D.   On: 12/04/2020 17:18   US Abdomen Limited RUQ (LIVER/GB)  Result Date: 12/12/2020 CLINICAL DATA:  Hyperbilirubinemia EXAM: ULTRASOUND ABDOMEN LIMITED RIGHT UPPER QUADRANT COMPARISON:  None. FINDINGS: Gallbladder: No evidence of gallbladder wall thickening or pericholecystic fluid. Small amount of sludge and tiny echogenic foci consistent with stones present within the gallbladder lumen. Common bile duct: Diameter: Normal at 3 mm Liver: No focal lesion identified. Within normal limits in parenchymal echogenicity. Portal vein is patent on color Doppler imaging with normal direction of blood flow towards the liver. Other: None. IMPRESSION: 1. Gallbladder sludge and small stones without additional ultrasound features to suggest acute cholecystitis. 2. No evidence of  biliary ductal dilatation. Electronically Signed   By: Jacqulynn Cadet M.D.   On: 12/12/2020 10:28   Microbiology No results found for this or any previous visit (from the past 240 hour(s)).  Lab Basic Metabolic Panel: Recent Labs  Lab 12/08/20 0254 12/08/20 0918 12/09/20 0206 12/10/20 0129 12/12/20 0314  NA 143  --  141 142 146*  K 3.5  --  3.6 4.0 4.1  CL 103  --  105 104 108  CO2 27  --  $R'27 27 27  'Cd$ GLUCOSE 98  --  99 114* 96  BUN 26*  --  23 24* 31*  CREATININE 1.19  --  1.06 1.11 1.26*  CALCIUM 8.6*  --  8.0* 8.4* 8.8*  MG  --  2.0 1.9 2.1 2.0  PHOS  --  3.1 3.4 3.9 3.1   Liver Function Tests: Recent Labs  Lab 12/08/20 0918 12/09/20 0206 12/10/20 0129 12/12/20 0314  AST 44* 31 32 43*  ALT $Re'16 14 16 24  'kUZ$ ALKPHOS 81 71 82 81  BILITOT 2.1* 1.5* 1.8* 3.8*  PROT 6.0* 5.2* 5.7* 5.8*  ALBUMIN 3.1* 2.7* 3.0* 3.0*   Recent Labs  Lab 12/12/20 0314  LIPASE 23   No results for input(s): AMMONIA in the last 168 hours. CBC: Recent Labs  Lab 12/08/20 0918 12/09/20 0206 12/10/20 0129 12/12/20 0314  WBC 8.6 6.2 9.7 8.2  NEUTROABS 6.8 4.8 8.2* 6.9  HGB 10.7* 9.9* 11.0* 10.9*  HCT 33.6* 31.1* 33.3* 33.3*  MCV 100.3* 100.3* 99.1 100.6*  PLT 211 183 258 259   Cardiac Enzymes: No results for input(s): CKTOTAL, CKMB, CKMBINDEX, TROPONINI in the last 168 hours. Sepsis Labs: Recent Labs  Lab 12/08/20 0918 12/09/20 0206 12/10/20 0129 12/12/20 0314  WBC 8.6 6.2 9.7 8.2    Procedures/Operations  CPT 27245-Cephalomedullary nailing of left intertrochanteric femur fracturedone by Dr. Doreatha Martin on 12/02/2020  RUQ U/S  Head CT   [facility], DO 12/14/2020, 6:30 PM

## 2021-01-12 NOTE — Progress Notes (Signed)
PROGRESS NOTE    Tom Robertson  LNL:[id number] DOB: Jul 28, 1926 DOA: 12/08/2020 PCP: Roetta Sessions, NP   Brief Narrative:  HPI per Dr. Neva Seat on 11/27/2020 Memory Dance Milleris a 87 y.o.malewith medical history significant ofhypertensionwho presents with hip pain following a fall from standing height. Patient was reaching to pick up a newspaper from the ground and fell on his left side. He reports severe left hip pain which is worse with any movement. Pain is 10 out of 10 at its worst but is improved to moderate pain now.He is been unable to walk since the fall. He does walk with a cane at baseline. He denies any head trauma or taking any anticoagulation. He has 2 sons who the EDP spoke with who confirmed that he is on minimal medications only antihypertensive, aspirin and vitamins. He denies fever, cough, chest pain, shortness of breath, abdominal pain, constipation, diarrhea, nausea.  ED Course:Vitals in ED significant for intermittent hypertension in the 408X to 448J systolic. Some readings of low heart rate in the upper 50s. Lab work-up showed BMP that was normal, LFTs with calcium of 8.6which corrects considering albumin of 3.1, protein of 5.4. CBC within normal notes. Respiratory panel for flu and Covid pending. Patient was given pain medication and and orthopedics was consulted for his fracture. Chest x-ray was without acute disease. DG pelvis and DG left femur showed left intratrochanteric hip fracture. CT head was without acute process, but incidental hyperdense region noticed which may need follow-up MRI. CT C-spine showed multilevel spondylosis but no acute fracture. CT abdomen redemonstrated left femoral fracture otherwise showed no acute abdominal or pelvic process diverticulosis without diverticulitis noted  **Interim History Patient was taken for surgical intervention for his intertrochanteric fracture of the left femur the setting of his  acute fall on 11/12/2020.  PT OT will need to evaluate the patient after surgical intervention to determine disposition and recommending SNF.  He was deemed medically stable to be discharged 12/08/20 but overnight became extremely confused and delirious.  He was very agitated on morning of 12/09/20 and given Zyprexa.  We have placed him on delirium precautions if he continues to be confused will work-up further with imaging currently feels that this is related to acute delirium.  Patient appeared to be calmer yesterday however this morning he had been calm combative and agitated again and had a rough night.  He is calmer after the olanzapine was given and because of his continued behavioral issues psychiatry was consulted for further evaluation and they recommended continue olanzapine and tapering down once his delirium resolves.  They have also recommended lorazepam 1 to 2 mg for severe agitation.  12/12/2020: Patient continues to be significantly agitated and combative and altered.  He remains encephalopathic and is likely delirious but will need to rule out other etiologies.  He is acutely retaining his urine so will place Foley catheter in for his acute urinary retention.  Will discuss with neurology about further head imaging prior to formal consultation and they recommend obtaining the MRI with and without contrast as well as an EEG and Dr. Cheral Marker made some recommendations about several medications and we have discontinued his olanzapine as well as his Atarax and started the patient on trazodone 100 g p.o. nightly.  Palliative care was consulted and further goals of care discussion patient was made comfort measures only and all interventions were stopped overnight and favor the patient's comfort he was placed on a morphine drip.  01-09-2021 Patient remains  on a morphine drip and was relatively comfortable today but then had some breakthrough pain and became agitated.  He was given as needed morphine.  Currently  resting comfortably with family at bedside.  Anticipate in-hospital death if he is not transferred to residential hospice as he is a very high risk of further decompensation as prognosis is extremely poor.  Assessment & Plan:   Principal Problem:   Closed comminuted intertrochanteric fracture of left femur (HCC) Active Problems:   Fall at home, initial encounter   HTN (hypertension)   Osteoporosis  Acute Fall Osteoporosis Intertrochanteric fracture of the left femur in the setting of Fall status post cephalomedullary nailing on 12/04/2019 -Patient had a fall from standing height while reaching to pick a newspaper up off the ground. No preceding symptoms no loss of consciousness typically walks with a cane. -Did not hit his head, not on anticoagulation -Orthopedic surgery evaluated and patient was taken to surgery on 12/01/2020 -With fracture from fall from standing height meets definition for osteoporosis. -Appreciate orthopedics recommendations -Pain control initiated and c/w Hydrocodone-Acetaminophen 1 tab po q6hprn Moderate Pain, and Hydromorphone 0.5 mg IV q4hprn Severe Pain; Given IV Morphine 2 mg in the ED; now patient remains on a morphine drip given that family has decided to transition to comfort measures given his extreme agitation, combativeness and encephalopathy -C/w Miralax 17 grams po daily as well as Dailyprn -Hip fracture precautions -Takes Aspirin 81 mg po Daily at Home this was held for surgery and not resume now given that he is full comfort measures -IVF has now stopped  -AM labs repeated, check vitamin D level -Further Care per Orthopedic Surgery and Dr. Doreatha Martin evaluated and recommended that the patient will require a cephalomedullary nailing of his left hip fracture.  Risks and benefits were discussed and he underwent surgical intervention on 11/12/2020 afternoon. -Orthopedic surgery recommending weightbearing as tolerated on the left lower extremity and maintaining  his Mepilex dressings and recommending Lovenox and SCDs as well as ambulation for VT prophylaxis as well as enoxaparin 30 mg subcu every 12 hours -PT OT recommending skilled nursing facility and stable to D/C but now likely is more residential hospice appropriate -At baseline he walks with a cane -Patient has been transitioned to comfort measures only and will not follow-up with Dr. Doreatha Martin in 2 weeks after and anticipating in hospital death if he is not transition to residential hospice  Acute Delirium with significant confusion and agitation and acute encephalopathy, had initially improved but now continues to worsen -Overnight on 12/28-12/29 he tried get out of bed and started having loud outbursts.  He was given 1 mg of IM Ativan the night before last and security was called to help calm him down  -Bladder scan to assess if the patient is retaining urine and he was Retaining; Given an I and O Cath; Repeat Bladder Scan showing that he is retaining urine so will place a Foley catheter; see below -Labs are within normal limits and mildly hypernatremic; renal function slightly worsened so we have started him on D5W at 75 mL/hr  -Given p.o. Zyprexa 5 mg sublingually and Dr. Cheral Marker recommends discontinuing this medication as well as discontinuing the Atarax.  He recommends starting trazodone 100 g p.o. nightly and continue to monitor the patient's clinical response to interventions and also obtaining an EEG and MRI.  Dr. Cheral Marker recommends doing a trial and error with opiates and recommends that if he does not improve holding his opiates but feels that if  he is in pain given the opiates may help with his delirium -continue to reorient frequently and continue with delirium precautions -Have family sitter at bedside and will continue  -Continue with bowel regimen and pain control -Not in restraints -Consulted Psychiatry Dr. Berniece Andreas and recommending continuing current plan of care and if necessary then  give 1-2 mg of IV/IM Lorazepam for Severe Agitation but may need to hold this given his age -Since he is not improving we will obtain an MRI of the brain with and without contrast as well as an EEG this was canceled after palliative care goals of care discussion -C/w Delirium Precautions -Pending results may need further neurological work-up and evaluation and formal Consultation but we will not pursue this given that family has elected for comfort -Will also consult Palliative Care for [place] Discussion and Symptom Management; palliative care met with the patient and after goals of care discussion family elected to transition the patient to full comfort measures and all medications not in the patient's comfort were discontinued including but not limited to, acetaminophen rectally every 6 hours as needed for fever or mild pain, biotin antiseptic oral rinse 50 mg as needed for dry mouth, bisacodyl suppository 10 mg rectally for constipation, glycopyrrolate 0.4 mg IV every 4 as needed for secretions, diazepam 0.5 to 1 mg every 1 hour for sedation anxiety, morphine infusion 1 to 5 mg continuous and 2 mg IV bolus every 30 minutes as needed for dyspnea pain as well as ondansetron 4 mg IV every 6 as needed for nausea vomiting and Liquifilm tears 1 drop in both eyes 4 times daily as needed dry eyes  Hypertension -Intermittently hypertensive in ED; Last blood Pressure was  -Patient takes Lisinopril 20 mg po Daily at home and was held initially but resumed at 10 mg p.o. daily but will now hold given his mildly elevated Renal Fxn and will now stop giving the patient is comfortable measures -Discontinued metoprolol tartrate 12.5 milligrams p.o. twice daily now that he is comfort measures -Blood pressure is now 125/82 today  Acute Urinary Retention -Patient continues to retain his urine despite multiple in and out catheterizations indwelling Foley catheter was placed yesterday and will continue for  comfort  Hyperglycemia in the setting of prediabetes -Checked HbA1c and was 5.7 -Continue to Monitor Blood Sugars carefully and if Necessary will place on Sensitive Novolog SSI AC -Patient's blood sugars have been ranging from 98-141 daily on BMP/CMP; Blood Sugar was 114 on last check but will not check anymore given that he is full comfort measures now  Dysphagia/Concern for Aspiration -Nursing had noted that he was gagging coughing up with an RN try to give him fluids and he spit out his medications and did not suck from straw -SLP evaluated and recommended MBS -MBS done and they recommended dysphagia 2 diet with thin liquids and no straws; now he is full comfort measures and on the morphine drip  Thrombocytopenia -Mild as Platelet Count went from 154 -> 145 and is now improved further and is now normal at 183 the day before yesterday and yesterday was 259 -Continue to Monitor for S/Sx of Bleeding; Currently no overt bleeding noted -We will not repeat CBC now the patient is fully comfort measures  Hyperbilirubinemia, worsened  -Patient's T bili was 2.1 is now improved to 1.5 but started worsening again and went to 1.8 yesterday and now 3.8 today -Likely reactive but will obtain a right upper quadrant ultrasound -Patient's direct bilirubin is 0.4  and indirect was 1.7 on 12/08/2020 -Right upper quadrant ultrasound shows "Gallbladder sludge and small stones without additional ultrasound features to suggest acute cholecystitis. No evidence of biliary ductal dilatation." -We will not continue to monitor and trend as patient is now full comfort measures and on a morphine drip and has a very high risk of in-hospital death transferto residential hospice  AKI on likely CKD stage IIIa -Patient BUNs/creatinine trended up to 25/1.71 -Improved with IV fluid hydration is now trended down to 24/1.11 the day before yesterday and has now slightly worsened and is 31/1.26; now we will start him on D5W at  75 mL's per hour -Continue monitor and trend and avoid nephrotoxic medications, contrast dyes, hypotension and renally dose medications -We will not monitor given that he is full comfort measures now  Macrocytic Anemia -Postoperatively patient's hemoglobin/hematocrit remained stable today at 10.9/30.3 and MCV is 100.6 -Check anemia panel and showed an iron level of 45, U IBC of 190, TIBC of 235, saturation ratios of 19%, ferritin level 117, folate level 29.2, and vitamin B12 level is 157 -We will not continue to monitor given that he is full comfort measures  HLD -Atorvastatin 20 mg p.o. daily has now been discontinued given that he is full comfort measures  Mildly elevated AST -Patient's AST is now 43 -RUQ obtained as above and now will not trend given transition to Comfort Measures  CT findings -CT head was without acute process, but incidental 1.7 cm hyperdense region noticed in the Left Frontal White matter that could reflect an area of dysplasia or vascular malformation -Follow-up nonemergent MRI with and without contrast recommended to better characterize and exclude underlying mass lesion  -Can be done after Hip procedure or in the outpatient setting  -Dr. Ashley Royalty spoke with the patient's son [name] and they did not want to pursue further work-up on this but may need further head imaging now that he is acutely delirious and extremely confused and combative caution with the neurologist he recommends obtaining MRI for completeness  GOC: DNR, poA -Now FULL COMFORT MEASURES and on a Morphine Gtt  DVT prophylaxis: None now patient is Full Comfort Measures  Code Status: DO NOT RESUSCITATE  Family Communication: Discussed with patient's two sons and Daughter in Social worker at bedside  Disposition Plan: Residential Hospice but if becomes to unstable to transfer will have In hospital Death as this would not be unexpected   Status is: Inpatient  Remains inpatient appropriate because:Unsafe d/c  plan, IV treatments appropriate due to intensity of illness or inability to take PO and Inpatient level of care appropriate due to severity of illness   Dispo: The patient is from: Home              Anticipated d/c is to: SNF              Anticipated d/c date is: 1-2 days              Patient currently is medically stable to d/c.   Consultants:   Orthopedic Surgery  Palliative Care Medicine    Procedures:  CPT 27245-Cephalomedullary nailing of left intertrochanteric femur fracture done by Dr. Jena Gauss on 11/12/2020  Antimicrobials:  Anti-infectives (From admission, onward)   Start     Dose/Rate Route Frequency Ordered Stop   12/06/2020 2200  ceFAZolin (ANCEF) IVPB 2g/100 mL premix        2 g 200 mL/hr over 30 Minutes Intravenous Every 8 hours 11/15/2020 1647 12/04/20 1530   11/16/2020  1450  vancomycin (VANCOCIN) powder  Status:  Discontinued          As needed 11/28/2020 1451 11/21/2020 1543   11/24/2020 1402  ceFAZolin (ANCEF) 2-4 GM/100ML-% IVPB       Note to Pharmacy: Grace Blight   : cabinet override      11/22/2020 1402 12/04/20 0214        Subjective: Seen and examined at bedside and he had some mildly labored breathing and rhonchorous breathing but had just been given some morphine.  Did appear somewhat calmer today than he did yesterday.  Family bedside states that he broke to his morphine drip and ended up coming a little agitated requiring a bit more pain medication.  Family content with the current care plan and patient's other son is flying in from [facility] tomorrow.  No other concerns or complaints at this time and family appreciative of care.  Objective: Vitals:   12/12/20 0330 12/12/20 1029 12/12/20 1447 08-Jan-2021 0817  BP: 132/70 (!) 157/95 (!) 100/48 125/82  Pulse: 70 93 88 88  Resp: 18   (!) 22  Temp: 98 F (36.7 C)  98.9 F (37.2 C) 97.7 F (36.5 C)  TempSrc: Oral  Axillary Oral  SpO2: 96% 91%  95%  Weight:      Height:        Intake/Output Summary (Last 24 hours)  at 08-Jan-2021 1311 Last data filed at 01-08-21 0400 Gross per 24 hour  Intake 10.79 ml  Output 750 ml  Net -739.21 ml   Filed Weights   12/04/20 1600  Weight: 61.9 kg   Examination: Physical Exam:  Constitutional: The patient is a thin elderly Caucasian male who is calmer today than he was yesterday on a morphine drip.  Eyes: Lids are normal ENMT: External Ears, Nose appear normal.  Neck: Appears normal, supple, no cervical masses, normal ROM, no appreciable thyromegaly: No appreciable JVD Respiratory: Has some diminished breathing to auscultation bilaterally with coarse breath sounds and some rhonchi.  Has a slightly tachypneic respiratory effort and he does have some pectus excavatum on his chest. Cardiovascular: RRR, no murmurs / rubs / gallops. S1 and S2 auscultated.  No appreciable extremity edema Abdomen: Soft, non-tender, non-distended. Bowel sounds positive.  GU: Deferred. Musculoskeletal: No clubbing / cyanosis of digits/nails. No joint deformity upper and lower extremities.  Skin: No rashes, lesions, ulcers on limited skin evaluation and he still has some bruising ecchymosis on his left leg. No induration; Warm and dry.  Neurologic: He is not awake or alert enough to participate and physical examination Psychiatric: Impaired judgment and insight.  He is not alert and oriented x 3. Normal mood and appropriate affect.   Data Reviewed: I have personally reviewed following labs and imaging studies  CBC: Recent Labs  Lab 12/07/20 0803 12/08/20 0918 12/09/20 0206 12/10/20 0129 12/12/20 0314  WBC 7.9 8.6 6.2 9.7 8.2  NEUTROABS  --  6.8 4.8 8.2* 6.9  HGB 9.2* 10.7* 9.9* 11.0* 10.9*  HCT 27.6* 33.6* 31.1* 33.3* 33.3*  MCV 98.9 100.3* 100.3* 99.1 100.6*  PLT 162 211 183 258 400   Basic Metabolic Panel: Recent Labs  Lab 12/07/20 0803 12/08/20 0254 12/08/20 0918 12/09/20 0206 12/10/20 0129 12/12/20 0314  NA 141 143  --  141 142 146*  K 3.6 3.5  --  3.6 4.0 4.1  CL  104 103  --  105 104 108  CO2 26 27  --  $R'27 27 27  'pu$ GLUCOSE 102* 98  --  99 114* 96  BUN 27* 26*  --  23 24* 31*  CREATININE 1.12 1.19  --  1.06 1.11 1.26*  CALCIUM 8.5* 8.6*  --  8.0* 8.4* 8.8*  MG  --   --  2.0 1.9 2.1 2.0  PHOS  --   --  3.1 3.4 3.9 3.1   GFR: Estimated Creatinine Clearance: 32.8 mL/min (A) (by C-G formula based on SCr of 1.26 mg/dL (H)). Liver Function Tests: Recent Labs  Lab 12/07/20 0803 12/08/20 0918 12/09/20 0206 12/10/20 0129 12/12/20 0314  AST 50* 44* 31 32 43*  ALT $Re'14 16 14 16 24  'upp$ ALKPHOS 67 81 71 82 81  BILITOT 1.5* 2.1* 1.5* 1.8* 3.8*  PROT 5.2* 6.0* 5.2* 5.7* 5.8*  ALBUMIN 2.6* 3.1* 2.7* 3.0* 3.0*   Recent Labs  Lab 12/12/20 0314  LIPASE 23   No results for input(s): AMMONIA in the last 168 hours. Coagulation Profile: No results for input(s): INR, PROTIME in the last 168 hours. Cardiac Enzymes: No results for input(s): CKTOTAL, CKMB, CKMBINDEX, TROPONINI in the last 168 hours. BNP (last 3 results) No results for input(s): PROBNP in the last 8760 hours. HbA1C: No results for input(s): HGBA1C in the last 72 hours. CBG: No results for input(s): GLUCAP in the last 168 hours. Lipid Profile: No results for input(s): CHOL, HDL, LDLCALC, TRIG, CHOLHDL, LDLDIRECT in the last 72 hours. Thyroid Function Tests: No results for input(s): TSH, T4TOTAL, FREET4, T3FREE, THYROIDAB in the last 72 hours. Anemia Panel: No results for input(s): VITAMINB12, FOLATE, FERRITIN, TIBC, IRON, RETICCTPCT in the last 72 hours. Sepsis Labs: No results for input(s): PROCALCITON, LATICACIDVEN in the last 168 hours.  No results found for this or any previous visit (from the past 240 hour(s)).   RN Pressure Injury Documentation:     Estimated body mass index is 20.15 kg/m as calculated from the following:   Height as of this encounter: $RemoveBeforeD'5\' 9"'[id number]$  (1.753 m).   Weight as of this encounter: 61.9 kg.  Malnutrition Type:   Malnutrition Characteristics:   Nutrition  Interventions:     Radiology Studies: US Abdomen Limited RUQ (LIVER/GB)  Result Date: 12/12/2020 CLINICAL DATA:  Hyperbilirubinemia EXAM: ULTRASOUND ABDOMEN LIMITED RIGHT UPPER QUADRANT COMPARISON:  None. FINDINGS: Gallbladder: No evidence of gallbladder wall thickening or pericholecystic fluid. Small amount of sludge and tiny echogenic foci consistent with stones present within the gallbladder lumen. Common bile duct: Diameter: Normal at 3 mm Liver: No focal lesion identified. Within normal limits in parenchymal echogenicity. Portal vein is patent on color Doppler imaging with normal direction of blood flow towards the liver. Other: None. IMPRESSION: 1. Gallbladder sludge and small stones without additional ultrasound features to suggest acute cholecystitis. 2. No evidence of biliary ductal dilatation. Electronically Signed   By: Jacqulynn Cadet M.D.   On: 12/12/2020 10:28   Scheduled Meds: . glycopyrrolate  0.4 mg Intravenous Q4H   Continuous Infusions: . morphine 2 mg/hr (Jan 01, 2021 1106)    LOS: 11 days   Kerney Elbe, DO [facility] PAGER is on [place]  If 7PM-7AM, please contact night-coverage [email or web address]

## 2021-01-12 NOTE — TOC Progression Note (Signed)
Transition of Care [facility]) - Progression Note    Patient Details  Name: Tom Robertson MRN: [id number] Date of Birth: Dec 09, 1926  [facility]) CM/SW Contact  Janae Bridgeman, RN Phone Number: 01/11/2021, 3:28 PM  Clinical Narrative:    Case management spoke with Lamarr Lulas, NP with Palliative care team and noted that the local inpatient hospice beds are full at this time.  I spoke with the son, Axcel Horsch, on the phone and he is flying in from [place] and should arrive in [place], [place] by tomorrow night.  The son, [name], and palliative care team agreed that the patient will receive comfort care measures on the floor at this time.  No referral was made to Inpatient hospice at this time at palliative team request since hospital death is expected.  Case management will continue to follow for hospice care support.   Expected Discharge Plan: Skilled Nursing Facility Barriers to Discharge: Continued Medical Work up  Expected Discharge Plan and Services Expected Discharge Plan: Skilled Nursing Facility In-house Referral: Clinical Social Work Discharge Planning Services: CM Consult Post Acute Care Choice: Skilled Nursing Facility Living arrangements for the past 2 months: Independent Living Facility ([facility])                                       Social Determinants of Health (SDOH) Interventions    Readmission Risk Interventions Readmission Risk Prevention Plan 12/07/2020  Post Dischage Appt Complete  Medication Screening Complete  Transportation Screening Complete  Some recent data might be hidden

## 2021-01-12 DEATH — deceased

## 2022-10-16 IMAGING — CT CT HEAD W/O CM
3 series · 15 of 47 positions shown, 18 images · non-contrast
Comparison: None.

CLINICAL DATA: Fell from standing

EXAM:
CT HEAD WITHOUT CONTRAST
TECHNIQUE: Contiguous axial images were obtained from the base of the skull
through the vertex without intravenous contrast.

[Series 3: head 5.0 h30s · axial · 0.49mm/px · z∈[-111,+24]mm · 9 of 33 slices shown, 12 images]
[im 3/33  brain]
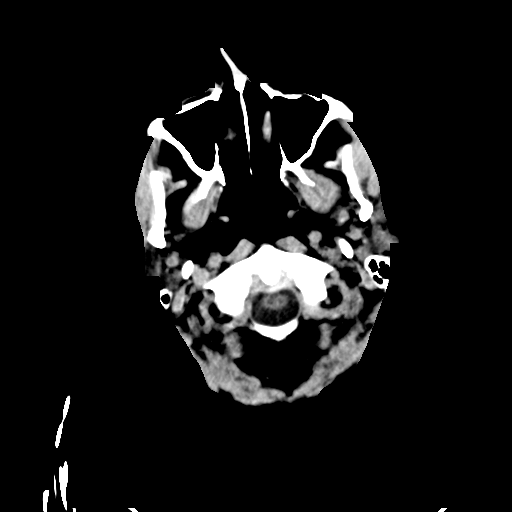
[im 3/33  bone]
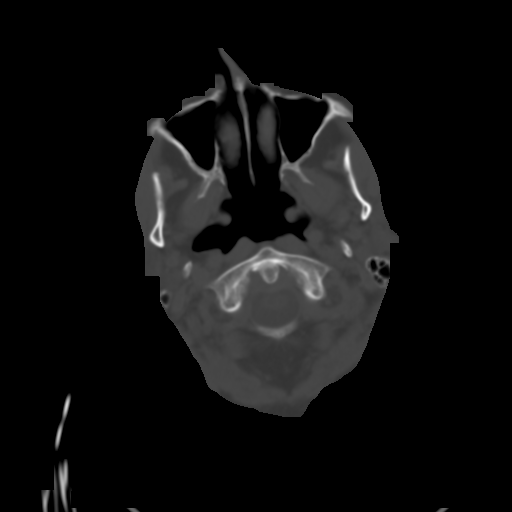
[im 6/33  brain]
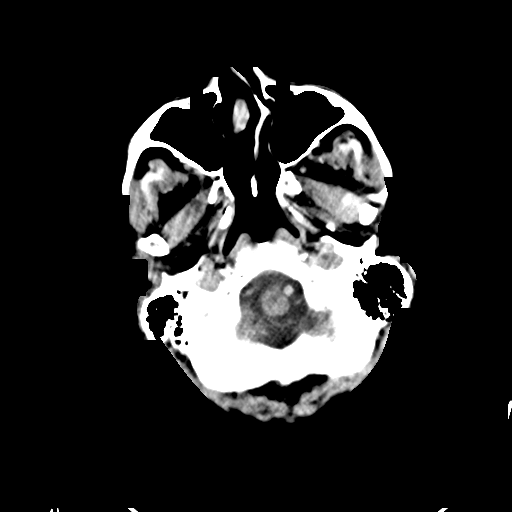
[im 9/33  brain]
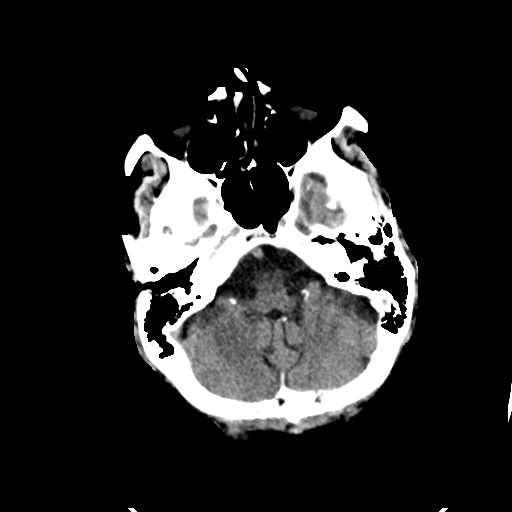
[im 13/33  brain]
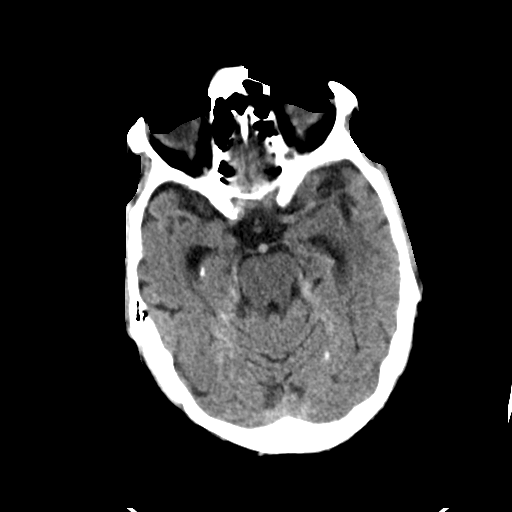
[im 17/33  brain]
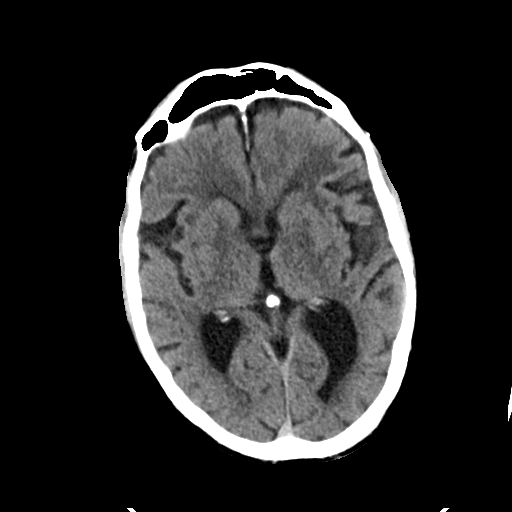
[im 17/33  bone]
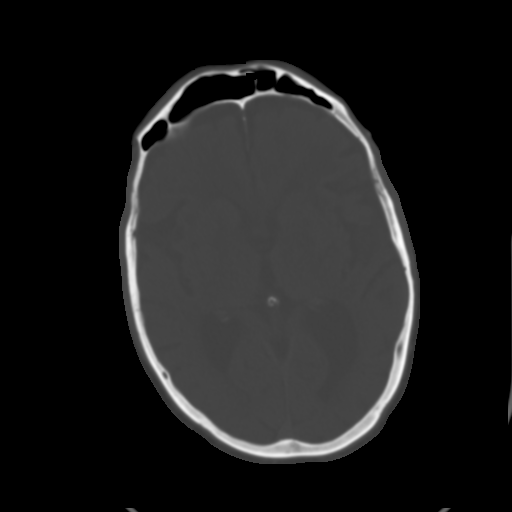
[im 20/33  brain]
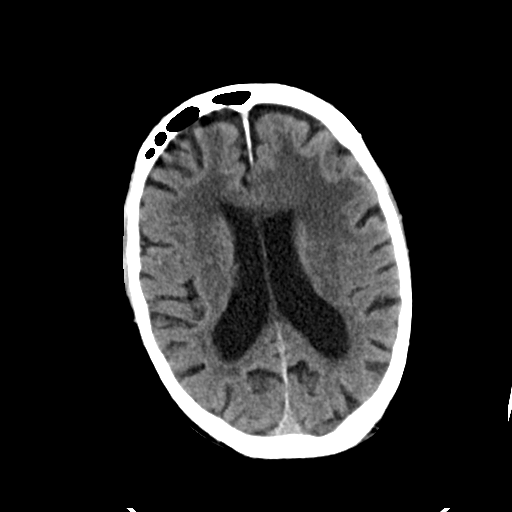
[im 24/33  brain]
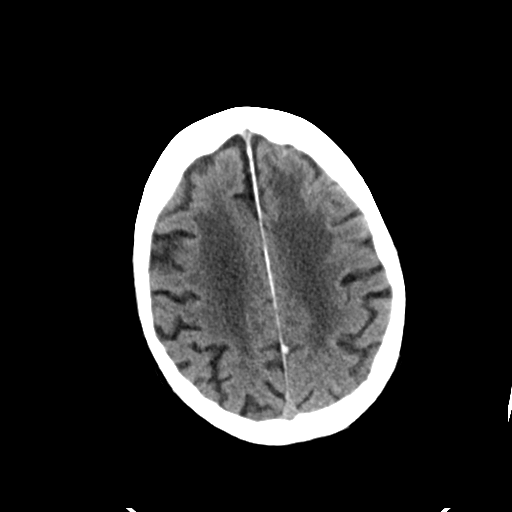
[im 27/33  brain]
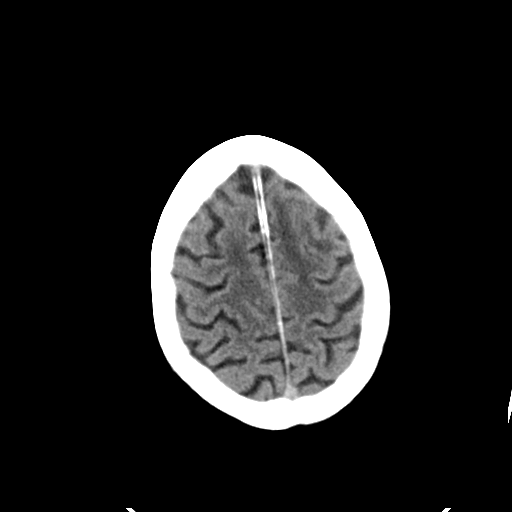
[im 30/33  brain]
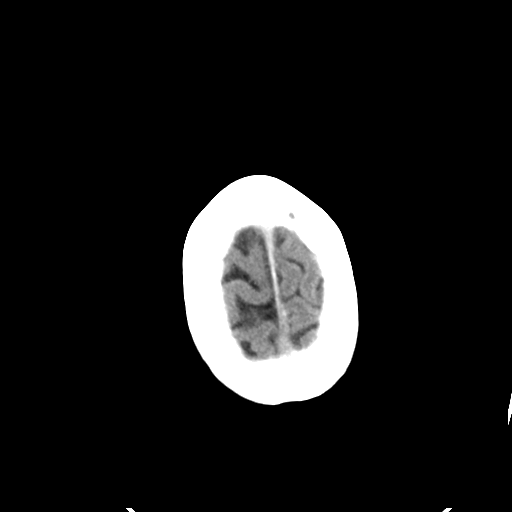
[im 30/33  bone]
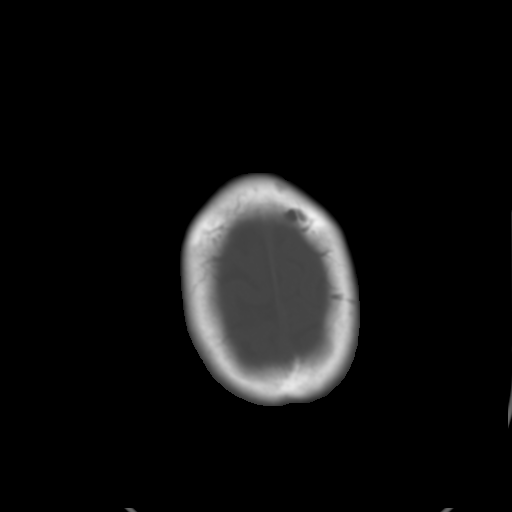

[Series 5: head 3.0 mpr cor · coronal · 0.35mm/px · 3 of 73 slices shown]
[im 25/73  brain]
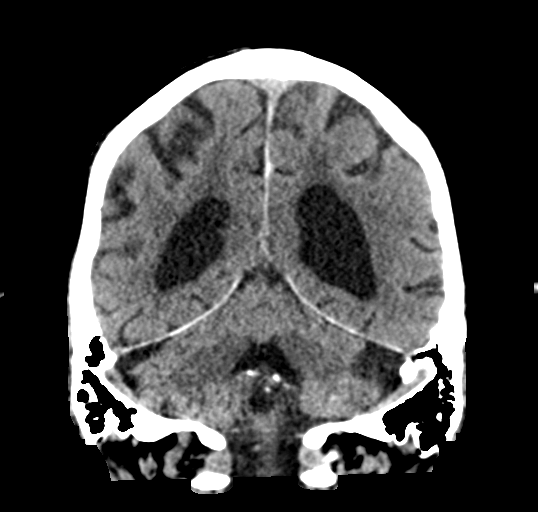
[im 33/73  brain]
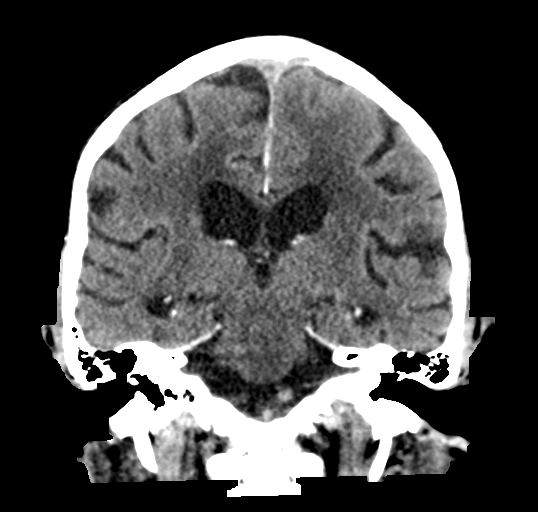
[im 41/73  brain]
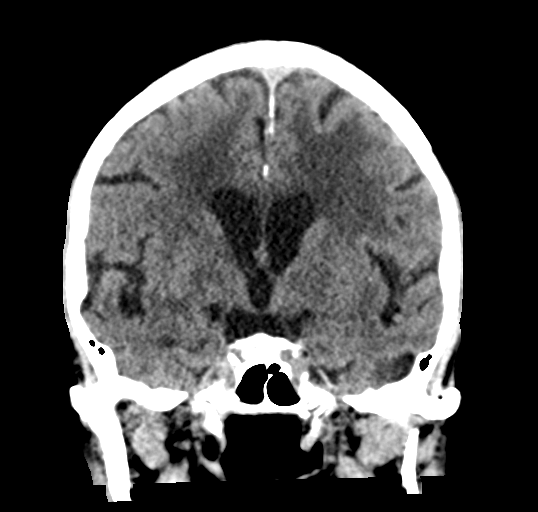

[Series 6: head 3.0 mpr sag · sagittal · 0.34mm/px · 3 of 66 slices shown]
[im 22/66  brain]
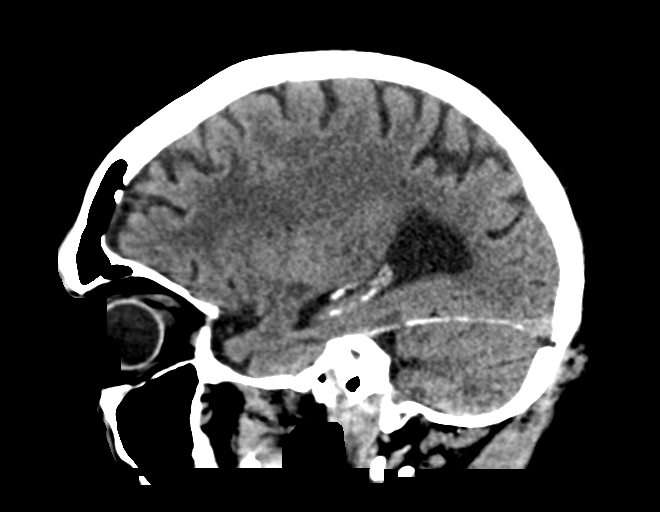
[im 33/66  brain]
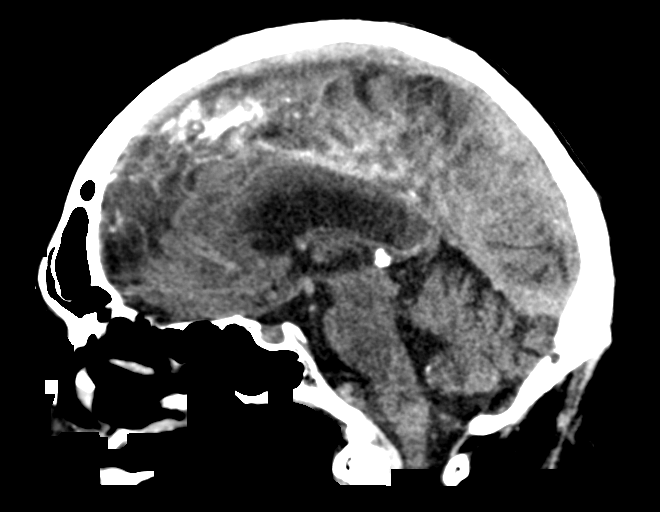
[im 44/66  brain]
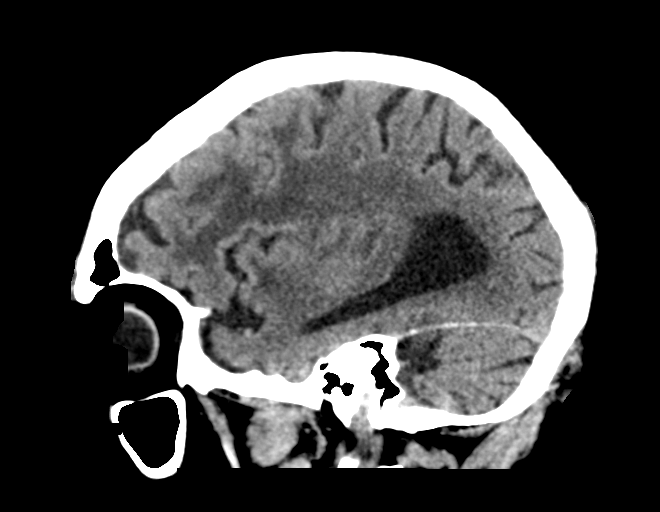

[15 of 47 positions shown; findings below may reference images not displayed]

FINDINGS: Brain: Confluent hypodensities throughout the periventricular and
subcortical white matter are compatible with chronic small vessel
ischemic changes. Hyperdense area in the left frontal
periventricular white matter measuring approximately 1.7 cm is
indeterminate, but could reflect an area of cortical dysplasia or
vascular malformation. Follow-up nonemergent MRI may be useful to
better characterize and exclude underlying mass. No evidence of
acute infarct or hemorrhage. Lateral ventricles and remaining
midline structures are unremarkable. No acute extra-axial fluid
collections. No mass effect. Diffuse age-appropriate cerebral
atrophy.

Vascular: No evidence of hyperdense vessel. Mild atherosclerosis.
Likely vascular malformation left frontal lobe as described above.

Skull: Normal. Negative for fracture or focal lesion.

Sinuses/Orbits: No acute finding.

Other: None.
IMPRESSION: 1. No acute intracranial process.
2. Extensive chronic small-vessel ischemic changes throughout the
white matter.
3. Likely incidental 1.7 cm hyperdense region in the left frontal
white matter, which could reflect an area of dysplasia or vascular
malformation. Follow-up nonemergent MRI with and without contrast
may be useful to better characterize and exclude underlying mass
lesion.
